# Patient Record
Sex: Male | Born: 1995 | Race: White | Hispanic: No | Marital: Single | Smoking: Former smoker
Health system: Southern US, Community
[De-identification: ages and names within clinical notes are randomized; demographics above are authoritative.]

## PROBLEM LIST (undated history)

## (undated) DIAGNOSIS — R42 Dizziness and giddiness: Secondary | ICD-10-CM

## (undated) DIAGNOSIS — F419 Anxiety disorder, unspecified: Secondary | ICD-10-CM

## (undated) DIAGNOSIS — F32A Depression, unspecified: Secondary | ICD-10-CM

## (undated) DIAGNOSIS — F909 Attention-deficit hyperactivity disorder, unspecified type: Secondary | ICD-10-CM

## (undated) DIAGNOSIS — K219 Gastro-esophageal reflux disease without esophagitis: Secondary | ICD-10-CM

## (undated) DIAGNOSIS — R55 Syncope and collapse: Secondary | ICD-10-CM

## (undated) DIAGNOSIS — E669 Obesity, unspecified: Secondary | ICD-10-CM

## (undated) DIAGNOSIS — R110 Nausea: Secondary | ICD-10-CM

## (undated) HISTORY — DX: Attention-deficit hyperactivity disorder, unspecified type: F90.9

## (undated) HISTORY — PX: OTHER SURGICAL HISTORY: SHX169

## (undated) HISTORY — DX: Dizziness and giddiness: R42

## (undated) HISTORY — DX: Gastro-esophageal reflux disease without esophagitis: K21.9

## (undated) HISTORY — DX: Nausea: R11.0

## (undated) HISTORY — DX: Anxiety disorder, unspecified: F41.9

## (undated) HISTORY — DX: Obesity, unspecified: E66.9

## (undated) HISTORY — DX: Syncope and collapse: R55

## (undated) HISTORY — DX: Depression, unspecified: F32.A

---
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
## 2013-02-11 ENCOUNTER — Ambulatory Visit (HOSPITAL_COMMUNITY)
Admission: RE | Admit: 2013-02-11 | Discharge: 2013-02-11 | Disposition: A | Discharge status: Home / Self Care | Payer: 59 | Source: Ambulatory Visit | Attending: Cardiovascular Disease | Admitting: Cardiovascular Disease

## 2013-02-11 DIAGNOSIS — R55 Syncope and collapse: Secondary | ICD-10-CM | POA: Insufficient documentation

## 2013-02-11 NOTE — Progress Notes (Signed)
*  PRELIMINARY RESULTS* Echocardiogram 2D Echocardiogram has been performed.  Kevin Wyatt 02/11/2013, 11:11 AM

## 2013-02-24 ENCOUNTER — Emergency Department (HOSPITAL_COMMUNITY)
Admission: EM | Admit: 2013-02-24 | Discharge: 2013-02-24 | Disposition: A | Discharge status: Home / Self Care | Payer: 59 | Attending: Emergency Medicine | Admitting: Emergency Medicine

## 2013-02-24 ENCOUNTER — Encounter (HOSPITAL_COMMUNITY): Payer: Self-pay | Admitting: *Deleted

## 2013-02-24 DIAGNOSIS — F909 Attention-deficit hyperactivity disorder, unspecified type: Secondary | ICD-10-CM | POA: Insufficient documentation

## 2013-02-24 DIAGNOSIS — F10929 Alcohol use, unspecified with intoxication, unspecified: Secondary | ICD-10-CM

## 2013-02-24 DIAGNOSIS — F17200 Nicotine dependence, unspecified, uncomplicated: Secondary | ICD-10-CM | POA: Insufficient documentation

## 2013-02-24 DIAGNOSIS — F1210 Cannabis abuse, uncomplicated: Secondary | ICD-10-CM | POA: Insufficient documentation

## 2013-02-24 DIAGNOSIS — Z79899 Other long term (current) drug therapy: Secondary | ICD-10-CM | POA: Insufficient documentation

## 2013-02-24 DIAGNOSIS — F1010 Alcohol abuse, uncomplicated: Secondary | ICD-10-CM | POA: Insufficient documentation

## 2013-02-24 LAB — URINALYSIS, ROUTINE W REFLEX MICROSCOPIC
Bilirubin Urine: NEGATIVE
Leukocytes, UA: NEGATIVE
Nitrite: NEGATIVE
Specific Gravity, Urine: 1.01 (ref 1.005–1.030)
Urobilinogen, UA: 0.2 mg/dL (ref 0.0–1.0)
pH: 6 (ref 5.0–8.0)

## 2013-02-24 LAB — COMPREHENSIVE METABOLIC PANEL
Albumin: 4.5 g/dL (ref 3.5–5.2)
Alkaline Phosphatase: 89 U/L (ref 39–117)
BUN: 6 mg/dL (ref 6–23)
Calcium: 9.1 mg/dL (ref 8.4–10.5)
GFR calc Af Amer: >90 mL/min (ref >90)
Potassium: 4.1 mEq/L (ref 3.5–5.1)
Sodium: 143 mEq/L (ref 135–145)
Total Protein: 7.6 g/dL (ref 6.0–8.3)

## 2013-02-24 LAB — RAPID URINE DRUG SCREEN, HOSP PERFORMED
Barbiturates: NOT DETECTED
Benzodiazepines: NOT DETECTED
Cocaine: NOT DETECTED
Opiates: NOT DETECTED

## 2013-02-24 LAB — CBC WITH DIFFERENTIAL/PLATELET
Basophils Relative: 0 % (ref 0–1)
Eosinophils Absolute: 0 10*3/uL (ref 0.0–0.7)
Eosinophils Relative: 0 % (ref 0–5)
MCH: 30 pg (ref 26.0–34.0)
MCHC: 35.4 g/dL (ref 30.0–36.0)
MCV: 84.9 fL (ref 78.0–100.0)
Monocytes Relative: 11 % (ref 3–12)
Neutrophils Relative %: 64 % (ref 43–77)
Platelets: 253 10*3/uL (ref 150–400)

## 2013-02-24 LAB — ETHANOL: Alcohol, Ethyl (B): 192 mg/dL — ABNORMAL HIGH (ref 0–11)

## 2013-02-24 NOTE — BHH Counselor (Signed)
Writer received call from [employer] requesting community resource information on etoh treatment. Writer printed and provided pt with the information. Denice Bors, AADC 02/24/2013 9:31 AM
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
## 2013-02-26 NOTE — ED Provider Notes (Signed)
Medical screening examination/treatment/procedure(s) were performed by non-physician practitioner and as supervising physician I was immediately available for consultation/collaboration.   Thalia Turkington E Erven Ramson, MD 02/26/13 1111 

## 2013-03-01 ENCOUNTER — Other Ambulatory Visit (HOSPITAL_COMMUNITY): Payer: Self-pay | Admitting: Internal Medicine

## 2013-03-01 DIAGNOSIS — R1032 Left lower quadrant pain: Secondary | ICD-10-CM

## 2013-03-06 ENCOUNTER — Ambulatory Visit (HOSPITAL_COMMUNITY): Payer: 59

## 2013-09-02 ENCOUNTER — Other Ambulatory Visit (HOSPITAL_COMMUNITY): Payer: Self-pay | Admitting: Family Medicine

## 2013-09-02 ENCOUNTER — Ambulatory Visit (HOSPITAL_COMMUNITY)
Admission: RE | Admit: 2013-09-02 | Discharge: 2013-09-02 | Disposition: A | Discharge status: Home / Self Care | Payer: 59 | Source: Ambulatory Visit | Attending: Family Medicine | Admitting: Family Medicine

## 2013-09-02 DIAGNOSIS — M546 Pain in thoracic spine: Secondary | ICD-10-CM

## 2013-09-02 DIAGNOSIS — M549 Dorsalgia, unspecified: Secondary | ICD-10-CM

## 2014-08-04 ENCOUNTER — Encounter (HOSPITAL_COMMUNITY): Payer: Self-pay | Admitting: Emergency Medicine

## 2014-08-04 ENCOUNTER — Emergency Department (HOSPITAL_COMMUNITY)
Admission: EM | Admit: 2014-08-04 | Discharge: 2014-08-04 | Disposition: A | Discharge status: Home / Self Care | Payer: 59 | Attending: Emergency Medicine | Admitting: Emergency Medicine

## 2014-08-04 DIAGNOSIS — F332 Major depressive disorder, recurrent severe without psychotic features: Secondary | ICD-10-CM

## 2014-08-04 DIAGNOSIS — F1994 Other psychoactive substance use, unspecified with psychoactive substance-induced mood disorder: Secondary | ICD-10-CM | POA: Diagnosis not present

## 2014-08-04 DIAGNOSIS — Z720 Tobacco use: Secondary | ICD-10-CM | POA: Insufficient documentation

## 2014-08-04 DIAGNOSIS — F1010 Alcohol abuse, uncomplicated: Secondary | ICD-10-CM

## 2014-08-04 DIAGNOSIS — F1019 Alcohol abuse with unspecified alcohol-induced disorder: Secondary | ICD-10-CM | POA: Diagnosis not present

## 2014-08-04 DIAGNOSIS — R45851 Suicidal ideations: Secondary | ICD-10-CM | POA: Diagnosis not present

## 2014-08-04 DIAGNOSIS — R45850 Homicidal ideations: Secondary | ICD-10-CM | POA: Insufficient documentation

## 2014-08-04 DIAGNOSIS — Z79899 Other long term (current) drug therapy: Secondary | ICD-10-CM | POA: Insufficient documentation

## 2014-08-04 LAB — COMPREHENSIVE METABOLIC PANEL
ALBUMIN: 4.6 g/dL (ref 3.5–5.2)
ALK PHOS: 77 U/L (ref 39–117)
ALT: 27 U/L (ref 0–53)
AST: 20 U/L (ref 0–37)
Anion gap: 16 — ABNORMAL HIGH (ref 5–15)
BILIRUBIN TOTAL: 0.2 mg/dL — AB (ref 0.3–1.2)
BUN: 8 mg/dL (ref 6–23)
CHLORIDE: 104 meq/L (ref 96–112)
CO2: 22 mEq/L (ref 19–32)
CREATININE: 0.89 mg/dL (ref 0.50–1.35)
Calcium: 9.6 mg/dL (ref 8.4–10.5)
GFR calc Af Amer: >90 mL/min (ref >90)
GFR calc non Af Amer: >90 mL/min (ref >90)
Glucose, Bld: 84 mg/dL (ref 70–99)
POTASSIUM: 3.8 meq/L (ref 3.7–5.3)
Sodium: 142 mEq/L (ref 137–147)
Total Protein: 8.2 g/dL (ref 6.0–8.3)

## 2014-08-04 LAB — CBC
HCT: 49.8 % (ref 39.0–52.0)
Hemoglobin: 17.6 g/dL — ABNORMAL HIGH (ref 13.0–17.0)
MCH: 30.7 pg (ref 26.0–34.0)
MCHC: 35.3 g/dL (ref 30.0–36.0)
MCV: 86.8 fL (ref 78.0–100.0)
PLATELETS: 263 10*3/uL (ref 150–400)
RBC: 5.74 MIL/uL (ref 4.22–5.81)
RDW: 12.8 % (ref 11.5–15.5)
WBC: 8.4 10*3/uL (ref 4.0–10.5)

## 2014-08-04 LAB — RAPID URINE DRUG SCREEN, HOSP PERFORMED
Amphetamines: NOT DETECTED
BARBITURATES: NOT DETECTED
BENZODIAZEPINES: NOT DETECTED
COCAINE: NOT DETECTED
Opiates: NOT DETECTED
Tetrahydrocannabinol: NOT DETECTED

## 2014-08-04 LAB — ETHANOL: ALCOHOL ETHYL (B): 222 mg/dL — AB (ref 0–11)

## 2014-08-04 LAB — SALICYLATE LEVEL: Salicylate Lvl: <2 mg/dL — ABNORMAL LOW (ref 2.8–20.0)

## 2014-08-04 LAB — ACETAMINOPHEN LEVEL

## 2014-08-04 MED ORDER — ONDANSETRON HCL 4 MG PO TABS: 4.0000 mg | ORAL_TABLET | Freq: Three times a day (TID) | ORAL | Status: DC | PRN

## 2014-08-04 MED ORDER — IBUPROFEN 200 MG PO TABS: 600.0000 mg | ORAL_TABLET | Freq: Three times a day (TID) | ORAL | Status: DC | PRN

## 2014-08-04 MED ORDER — LORAZEPAM 1 MG PO TABS: 1.0000 mg | ORAL_TABLET | Freq: Three times a day (TID) | ORAL | Status: DC | PRN

## 2014-08-04 MED ORDER — ZOLPIDEM TARTRATE 5 MG PO TABS: 5.0000 mg | ORAL_TABLET | Freq: Every evening | ORAL | Status: DC | PRN

## 2014-08-04 MED ORDER — NICOTINE 21 MG/24HR TD PT24
21.0000 mg | MEDICATED_PATCH | Freq: Every day | TRANSDERMAL | Status: DC
Start: 2014-08-04 — End: 2014-08-04

## 2014-08-04 MED ORDER — ALUM & MAG HYDROXIDE-SIMETH 200-200-20 MG/5ML PO SUSP: 30.0000 mL | ORAL | Status: DC | PRN

## 2014-08-04 MED ORDER — ACETAMINOPHEN 325 MG PO TABS: 650.0000 mg | ORAL_TABLET | ORAL | Status: DC | PRN

## 2014-08-04 NOTE — ED Notes (Signed)
Pt belongings consisting of pants, shirt, socks, shoes, 2 packs of cigarettes, Black Iphone, Wallet containing 2 global cash cards, [employer] gift card, Identification card, Silver necklace, and 2 earrings.
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
## 2014-08-04 NOTE — BH Assessment (Signed)
Writer informed that patient's DOB on IVC was incorrect. Writer contacted day time [employer] [place]. He sts that it will be ok to draw a line and initial DOB. Writer wrote in DOB and magistrate stated that this would be ok.
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
## 2014-08-04 NOTE — ED Provider Notes (Signed)
CSN: [id number]     Arrival date & time 08/04/14  0055 History   First MD Initiated Contact with Patient 08/04/14 0204     Chief Complaint  Patient presents with  . Suicidal  . Homicidal      HPI Patient presents with both suicidal and homicidal ideation tonight.  He does admit to drinking alcohol.  He states he has a history of depression but is not on any medications.  He states he was going to kill himself by cutting his wrists and then slitting his throat.  Does not have a psychiatrist.  No other significant complaints.  No prior suicide attempts.  No prior mental health hospitalizations per the patient.   History reviewed. No pertinent past medical history. History reviewed. No pertinent past surgical history. No family history on file. History  Substance Use Topics  . Smoking status: Current Every Day Smoker  . Smokeless tobacco: Not on file  . Alcohol Use: Yes     Comment: occasionally     Review of Systems  All other systems reviewed and are negative.     Allergies  Review of patient's allergies indicates no known allergies.  Home Medications   Prior to Admission medications   Medication Sig Start Date End Date Taking? Authorizing Provider  methylphenidate (CONCERTA) 36 MG CR tablet Take 36 mg by mouth daily.    Yes Historical Provider, MD   BP 159/90  Pulse 108  Temp(Src) 98.4 F (36.9 C) (Oral)  Resp 18  SpO2 98% Physical Exam  Nursing note and vitals reviewed. Constitutional: He is oriented to person, place, and time. He appears well-developed and well-nourished.  HENT:  Head: Normocephalic and atraumatic.  Eyes: EOM are normal.  Neck: Normal range of motion.  Cardiovascular: Normal rate, regular rhythm, normal heart sounds and intact distal pulses.   Pulmonary/Chest: Effort normal and breath sounds normal. No respiratory distress.  Abdominal: Soft. He exhibits no distension. There is no tenderness.  Musculoskeletal: Normal range of motion.   Neurological: He is alert and oriented to person, place, and time.  Skin: Skin is warm and dry.  Psychiatric:  Flat affect.  Suicidal ideation with plan    ED Course  Procedures (including critical care time) Labs Review Labs Reviewed  CBC - Abnormal; Notable for the following:    Hemoglobin 17.6 (*)    All other components within normal limits  COMPREHENSIVE METABOLIC PANEL - Abnormal; Notable for the following:    Total Bilirubin 0.2 (*)    Anion gap 16 (*)    All other components within normal limits  ETHANOL - Abnormal; Notable for the following:    Alcohol, Ethyl (B) 222 (*)    All other components within normal limits  SALICYLATE LEVEL - Abnormal; Notable for the following:    Salicylate Lvl <2.0 (*)    All other components within normal limits  ACETAMINOPHEN LEVEL  URINE RAPID DRUG SCREEN (HOSP PERFORMED)    Imaging Review No results found.   EKG Interpretation None      MDM   Final diagnoses:  None    Patient is under IVC by police department.  Agree with IVC.  Suicidal and homicidal.  CT is to evaluate.  Medically clear    Lyanne CoKevin M Susann [place], MD 08/04/14 [phone]

## 2014-08-04 NOTE — BH Assessment (Signed)
[place] from [place] [facility]. St[facility] by Dr. Wendall StadeKohl. Nursing report # is [phone].
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
## 2014-08-04 NOTE — ED Notes (Signed)
Bed: WBH34 Expected date:  Expected time:  Means of arrival:  Comments: tr4 

## 2015-03-16 ENCOUNTER — Encounter (HOSPITAL_COMMUNITY): Payer: Self-pay | Admitting: Emergency Medicine

## 2015-03-16 ENCOUNTER — Emergency Department (HOSPITAL_COMMUNITY)
Admission: EM | Admit: 2015-03-16 | Discharge: 2015-03-16 | Disposition: A | Discharge status: Home / Self Care | Payer: 59 | Attending: Emergency Medicine | Admitting: Emergency Medicine

## 2015-03-16 DIAGNOSIS — F10129 Alcohol abuse with intoxication, unspecified: Secondary | ICD-10-CM | POA: Insufficient documentation

## 2015-03-16 DIAGNOSIS — F10929 Alcohol use, unspecified with intoxication, unspecified: Secondary | ICD-10-CM

## 2015-03-16 DIAGNOSIS — Z720 Tobacco use: Secondary | ICD-10-CM | POA: Diagnosis not present

## 2015-03-16 DIAGNOSIS — Y9289 Other specified places as the place of occurrence of the external cause: Secondary | ICD-10-CM | POA: Diagnosis not present

## 2015-03-16 DIAGNOSIS — Y998 Other external cause status: Secondary | ICD-10-CM | POA: Insufficient documentation

## 2015-03-16 DIAGNOSIS — S60512A Abrasion of left hand, initial encounter: Secondary | ICD-10-CM | POA: Diagnosis not present

## 2015-03-16 DIAGNOSIS — Z79899 Other long term (current) drug therapy: Secondary | ICD-10-CM | POA: Diagnosis not present

## 2015-03-16 DIAGNOSIS — Y9389 Activity, other specified: Secondary | ICD-10-CM | POA: Insufficient documentation

## 2015-03-16 DIAGNOSIS — X58XXXA Exposure to other specified factors, initial encounter: Secondary | ICD-10-CM | POA: Insufficient documentation

## 2015-03-16 DIAGNOSIS — R451 Restlessness and agitation: Secondary | ICD-10-CM

## 2015-03-16 LAB — CBC WITH DIFFERENTIAL/PLATELET
Basophils Absolute: 0 10*3/uL (ref 0.0–0.1)
Basophils Relative: 0 % (ref 0–1)
EOS ABS: 0 10*3/uL (ref 0.0–0.7)
Eosinophils Relative: 0 % (ref 0–5)
HEMATOCRIT: 45.9 % (ref 39.0–52.0)
HEMOGLOBIN: 16.3 g/dL (ref 13.0–17.0)
LYMPHS PCT: 13 % (ref 12–46)
Lymphs Abs: 1.5 10*3/uL (ref 0.7–4.0)
MCH: 30.4 pg (ref 26.0–34.0)
MCHC: 35.5 g/dL (ref 30.0–36.0)
MCV: 85.6 fL (ref 78.0–100.0)
MONOS PCT: 9 % (ref 3–12)
Monocytes Absolute: 1.1 10*3/uL — ABNORMAL HIGH (ref 0.1–1.0)
NEUTROS PCT: 78 % — AB (ref 43–77)
Neutro Abs: 8.9 10*3/uL — ABNORMAL HIGH (ref 1.7–7.7)
PLATELETS: 267 10*3/uL (ref 150–400)
RBC: 5.36 MIL/uL (ref 4.22–5.81)
RDW: 12.7 % (ref 11.5–15.5)
WBC: 11.5 10*3/uL — AB (ref 4.0–10.5)

## 2015-03-16 LAB — URINALYSIS, ROUTINE W REFLEX MICROSCOPIC
BILIRUBIN URINE: NEGATIVE
GLUCOSE, UA: NEGATIVE mg/dL
Hgb urine dipstick: NEGATIVE
KETONES UR: NEGATIVE mg/dL
Leukocytes, UA: NEGATIVE
Nitrite: NEGATIVE
Protein, ur: NEGATIVE mg/dL
SPECIFIC GRAVITY, URINE: 1.003 — AB (ref 1.005–1.030)
Urobilinogen, UA: 0.2 mg/dL (ref 0.0–1.0)
pH: 6 (ref 5.0–8.0)

## 2015-03-16 LAB — COMPREHENSIVE METABOLIC PANEL
ALK PHOS: 66 U/L (ref 38–126)
ALT: 25 U/L (ref 17–63)
AST: 24 U/L (ref 15–41)
Albumin: 4.2 g/dL (ref 3.5–5.0)
Anion gap: 12 (ref 5–15)
BUN: 9 mg/dL (ref 6–20)
CO2: 21 mmol/L — AB (ref 22–32)
Calcium: 8.8 mg/dL — ABNORMAL LOW (ref 8.9–10.3)
Chloride: 110 mmol/L (ref 101–111)
Creatinine, Ser: 1.28 mg/dL — ABNORMAL HIGH (ref 0.61–1.24)
GFR calc non Af Amer: >60 mL/min (ref >60)
Glucose, Bld: 105 mg/dL — ABNORMAL HIGH (ref 65–99)
Potassium: 3.8 mmol/L (ref 3.5–5.1)
Sodium: 143 mmol/L (ref 135–145)
TOTAL PROTEIN: 7.1 g/dL (ref 6.5–8.1)
Total Bilirubin: 0.4 mg/dL (ref 0.3–1.2)

## 2015-03-16 LAB — RAPID URINE DRUG SCREEN, HOSP PERFORMED
AMPHETAMINES: NOT DETECTED
Barbiturates: NOT DETECTED
Benzodiazepines: NOT DETECTED
COCAINE: NOT DETECTED
Opiates: NOT DETECTED
Tetrahydrocannabinol: NOT DETECTED

## 2015-03-16 LAB — ETHANOL: Alcohol, Ethyl (B): 292 mg/dL — ABNORMAL HIGH (ref <5)

## 2015-03-16 LAB — ACETAMINOPHEN LEVEL: Acetaminophen (Tylenol), Serum: <10 ug/mL — ABNORMAL LOW (ref 10–30)

## 2015-03-16 LAB — SALICYLATE LEVEL: Salicylate Lvl: <4 mg/dL (ref 2.8–30.0)

## 2015-03-16 MED ORDER — LORAZEPAM 2 MG/ML IJ SOLN
1.0000 mg | Freq: Once | INTRAMUSCULAR | Status: AC
  Administered 2015-03-16: 1 mg via INTRAMUSCULAR

## 2015-03-16 MED ORDER — SODIUM CHLORIDE 0.9 % IV SOLN
INTRAVENOUS | Status: AC
  Administered 2015-03-16 (×2): via INTRAVENOUS

## 2015-03-16 MED ORDER — ZIPRASIDONE MESYLATE 20 MG IM SOLR
20.0000 mg | Freq: Once | INTRAMUSCULAR | Status: AC
  Administered 2015-03-16: 20 mg via INTRAMUSCULAR

## 2015-03-16 MED ORDER — LORAZEPAM 2 MG/ML IJ SOLN
INTRAMUSCULAR | Status: AC
  Filled 2015-03-16: qty 1

## 2015-03-16 NOTE — ED Notes (Addendum)
Upon entering room to check on patient, patient noted to be increasingly emotional. [employer] at bedside advise that they are currently pursuing warrants for patient. Patient states "please just let me leave of my own free will". Attempts to de-escalate patient unsuccessful. Patient restrained by [employer] to stretcher using handcuffs at this time. [employer] officer states that patient is currently in their custody.

## 2015-03-16 NOTE — ED Notes (Signed)
Pt resting, [employer] at bedside.  

## 2015-03-16 NOTE — Discharge Instructions (Signed)
Alcohol Intoxication  Alcohol intoxication occurs when the amount of alcohol that a person has consumed impairs his or her ability to mentally and physically function. Alcohol directly impairs the normal chemical activity of the brain. Drinking large amounts of alcohol can lead to changes in mental function and behavior, and it can cause many physical effects that can be harmful.   Alcohol intoxication can range in severity from mild to very severe. Various factors can affect the level of intoxication that occurs, such as the person's age, gender, weight, frequency of alcohol consumption, and the presence of other medical conditions (such as diabetes, seizures, or heart conditions). Dangerous levels of alcohol intoxication may occur when people drink large amounts of alcohol in a short period (binge drinking). Alcohol can also be especially dangerous when combined with certain prescription medicines or "recreational" drugs.  SIGNS AND SYMPTOMS  Some common signs and symptoms of mild alcohol intoxication include:  · Loss of coordination.  · Changes in mood and behavior.  · Impaired judgment.  · Slurred speech.  As alcohol intoxication progresses to more severe levels, other signs and symptoms will appear. These may include:  · Vomiting.  · Confusion and impaired memory.  · Slowed breathing.  · Seizures.  · Loss of consciousness.  DIAGNOSIS   Your health care provider will take a medical history and perform a physical exam. You will be asked about the amount and type of alcohol you have consumed. Blood tests will be done to measure the concentration of alcohol in your blood. In many places, your blood alcohol level must be lower than 80 mg/dL (0.08%) to legally drive. However, many dangerous effects of alcohol can occur at much lower levels.   TREATMENT   People with alcohol intoxication often do not require treatment. Most of the effects of alcohol intoxication are temporary, and they go away as the alcohol naturally  leaves the body. Your health care provider will monitor your condition until you are stable enough to go home. Fluids are sometimes given through an IV access tube to help prevent dehydration.   HOME CARE INSTRUCTIONS  · Do not drive after drinking alcohol.  · Stay hydrated. Drink enough water and fluids to keep your urine clear or pale yellow. Avoid caffeine.    · Only take over-the-counter or prescription medicines as directed by your health care provider.    SEEK MEDICAL CARE IF:   · You have persistent vomiting.    · You do not feel better after a few days.  · You have frequent alcohol intoxication. Your health care provider can help determine if you should see a substance use treatment counselor.  SEEK IMMEDIATE MEDICAL CARE IF:   · You become shaky or tremble when you try to stop drinking.    · You shake uncontrollably (seizure).    · You throw up (vomit) blood. This may be bright red or may look like black coffee grounds.    · You have blood in your stool. This may be bright red or may appear as a black, tarry, bad smelling stool.    · You become lightheaded or faint.    MAKE SURE YOU:   · Understand these instructions.  · Will watch your condition.  · Will get help right away if you are not doing well or get worse.  Document Released: 07/13/2005 Document Revised: 06/05/2013 Document Reviewed: 03/08/2013  ExitCare® Patient Information ©2015 [employer]. This information is not intended to replace advice given to you by your health care provider. Make sure   you discuss any questions you have with your health care provider.
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
## 2015-03-16 NOTE — ED Notes (Signed)
Patient remains in police custody. [employer] officer reapplied handcuffs to bilateral wrists at this time.
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
## 2015-05-07 ENCOUNTER — Encounter: Payer: Self-pay | Admitting: *Deleted

## 2015-06-10 ENCOUNTER — Encounter: Payer: Self-pay | Admitting: Cardiovascular Disease

## 2015-08-04 ENCOUNTER — Encounter (HOSPITAL_COMMUNITY): Payer: Self-pay | Admitting: *Deleted

## 2015-08-04 ENCOUNTER — Emergency Department (HOSPITAL_COMMUNITY)
Admission: EM | Admit: 2015-08-04 | Discharge: 2015-08-04 | Disposition: A | Discharge status: Home / Self Care | Payer: Commercial Managed Care - HMO | Attending: Emergency Medicine | Admitting: Emergency Medicine

## 2015-08-04 ENCOUNTER — Emergency Department (HOSPITAL_COMMUNITY): Payer: Commercial Managed Care - HMO

## 2015-08-04 DIAGNOSIS — K219 Gastro-esophageal reflux disease without esophagitis: Secondary | ICD-10-CM | POA: Diagnosis not present

## 2015-08-04 DIAGNOSIS — Z8659 Personal history of other mental and behavioral disorders: Secondary | ICD-10-CM | POA: Insufficient documentation

## 2015-08-04 DIAGNOSIS — E669 Obesity, unspecified: Secondary | ICD-10-CM | POA: Insufficient documentation

## 2015-08-04 DIAGNOSIS — Z720 Tobacco use: Secondary | ICD-10-CM | POA: Insufficient documentation

## 2015-08-04 DIAGNOSIS — R079 Chest pain, unspecified: Secondary | ICD-10-CM | POA: Diagnosis present

## 2015-08-04 LAB — CBC WITH DIFFERENTIAL/PLATELET
BASOS PCT: 0 %
Basophils Absolute: 0 10*3/uL (ref 0.0–0.1)
Eosinophils Absolute: 0 10*3/uL (ref 0.0–0.7)
Eosinophils Relative: 1 %
HEMATOCRIT: 44.1 % (ref 39.0–52.0)
HEMOGLOBIN: 15.3 g/dL (ref 13.0–17.0)
LYMPHS ABS: 1.5 10*3/uL (ref 0.7–4.0)
Lymphocytes Relative: 27 %
MCH: 29.7 pg (ref 26.0–34.0)
MCHC: 34.7 g/dL (ref 30.0–36.0)
MCV: 85.6 fL (ref 78.0–100.0)
MONO ABS: 0.7 10*3/uL (ref 0.1–1.0)
MONOS PCT: 13 %
NEUTROS ABS: 3.4 10*3/uL (ref 1.7–7.7)
NEUTROS PCT: 60 %
Platelets: 218 10*3/uL (ref 150–400)
RBC: 5.15 MIL/uL (ref 4.22–5.81)
RDW: 12.5 % (ref 11.5–15.5)
WBC: 5.7 10*3/uL (ref 4.0–10.5)

## 2015-08-04 LAB — COMPREHENSIVE METABOLIC PANEL
ALBUMIN: 4.2 g/dL (ref 3.5–5.0)
ALK PHOS: 54 U/L (ref 38–126)
ALT: 26 U/L (ref 17–63)
ANION GAP: 6 (ref 5–15)
AST: 18 U/L (ref 15–41)
BILIRUBIN TOTAL: 0.7 mg/dL (ref 0.3–1.2)
BUN: 13 mg/dL (ref 6–20)
CALCIUM: 9.4 mg/dL (ref 8.9–10.3)
CO2: 28 mmol/L (ref 22–32)
Chloride: 106 mmol/L (ref 101–111)
Creatinine, Ser: 1.12 mg/dL (ref 0.61–1.24)
GLUCOSE: 78 mg/dL (ref 65–99)
POTASSIUM: 3.9 mmol/L (ref 3.5–5.1)
Sodium: 140 mmol/L (ref 135–145)
TOTAL PROTEIN: 7.1 g/dL (ref 6.5–8.1)

## 2015-08-04 LAB — I-STAT TROPONIN, ED: TROPONIN I, POC: 0 ng/mL (ref 0.00–0.08)

## 2015-08-04 MED ORDER — RANITIDINE HCL 150 MG PO CAPS: 300.0000 mg | ORAL_CAPSULE | Freq: Every evening | ORAL | Status: DC

## 2015-08-04 MED ORDER — TRAMADOL HCL 50 MG PO TABS: 50.0000 mg | ORAL_TABLET | Freq: Two times a day (BID) | ORAL | Status: DC | PRN

## 2015-08-04 NOTE — Discharge Instructions (Signed)
Follow up with your md next week for recheck °

## 2015-08-04 NOTE — ED Provider Notes (Signed)
CSN: [id number]     Arrival date & time 08/04/15  1631 History   First MD Initiated Contact with Patient 08/04/15 1646     Chief Complaint  Patient presents with  . Chest Pain     (Consider location/radiation/quality/duration/timing/severity/associated sxs/prior Treatment) Patient is a 19 y.o. male presenting with chest pain. The history is provided by the patient (The patient complains of chest pain over sternum consistent aching for a few days no pain with inspiration the pain is nonexertional).  Chest Pain Pain location:  Substernal area Pain quality: aching   Pain radiates to:  Does not radiate Pain radiates to the back: no   Pain severity:  Mild Onset quality:  Sudden Timing:  Intermittent Associated symptoms: no abdominal pain, no back pain, no cough, no fatigue and no headache     Past Medical History  Diagnosis Date  . ADHD (attention deficit hyperactivity disorder)   . Anxiety   . Dizziness   . GERD (gastroesophageal reflux disease)     w/ prrosis & erucatation  . Nausea   . Obesity   . Vasovagal syncope    History reviewed. No pertinent past surgical history. Family History  Problem Relation Age of Onset  . Hypertension Mother   . Hypertension Father   . Alzheimer's disease Maternal Grandmother   . Stroke Maternal Grandmother   . Heart attack Maternal Grandmother   . Hypertension Maternal Grandfather   . Hypertension Paternal Grandmother   . Heart attack Paternal Grandmother   . Lung cancer Paternal Grandmother   . Cancer Paternal Grandfather    Social History  Substance Use Topics  . Smoking status: Current Every Day Smoker  . Smokeless tobacco: None     Comment: uses vape   . Alcohol Use: Yes     Comment: excessive tonight    Review of Systems  Constitutional: Negative for appetite change and fatigue.  HENT: Negative for congestion, ear discharge and sinus pressure.   Eyes: Negative for discharge.  Respiratory: Negative for cough.    Cardiovascular: Positive for chest pain.  Gastrointestinal: Negative for abdominal pain and diarrhea.  Genitourinary: Negative for frequency and hematuria.  Musculoskeletal: Negative for back pain.  Skin: Negative for rash.  Neurological: Negative for seizures and headaches.  Psychiatric/Behavioral: Negative for hallucinations.      Allergies  Review of patient's allergies indicates no known allergies.  Home Medications   Prior to Admission medications   Medication Sig Start Date End Date Taking? Authorizing Provider  guaifenesin (WAL-TUSSIN) 100 MG/5ML syrup Take 200 mg by mouth 3 (three) times daily as needed for cough.   Yes Historical Provider, MD  ranitidine (ZANTAC) 150 MG capsule Take 2 capsules (300 mg total) by mouth every evening. 08/04/15   Bethann BerkshireJoseph Taitum Menton, MD  traMADol (ULTRAM) 50 MG tablet Take 1 tablet (50 mg total) by mouth every 12 (twelve) hours as needed for moderate pain. 08/04/15   Bethann BerkshireJoseph Ashauna Bertholf, MD   BP 119/76 mmHg  Pulse 70  Temp(Src) 97.7 F (36.5 C) (Oral)  Resp 13  Ht 5\' 10"  (1.778 m)  Wt 220 lb (99.791 kg)  BMI 31.57 kg/m2  SpO2 97% Physical Exam  Constitutional: He is oriented to person, place, and time. He appears well-developed.  HENT:  Head: Normocephalic.  Eyes: Conjunctivae and EOM are normal. No scleral icterus.  Neck: Neck supple. No thyromegaly present.  Cardiovascular: Normal rate and regular rhythm.  Exam reveals no gallop and no friction rub.   No murmur heard. Pulmonary/Chest:  No stridor. He has no wheezes. He has no rales. He exhibits no tenderness.  Abdominal: He exhibits no distension. There is no tenderness. There is no rebound.  Musculoskeletal: Normal range of motion. He exhibits no edema.  Lymphadenopathy:    He has no cervical adenopathy.  Neurological: He is oriented to person, place, and time. He exhibits normal muscle tone. Coordination normal.  Skin: No rash noted. No erythema.  Psychiatric: He has a normal mood and  affect. His behavior is normal.    ED Course  Procedures (including critical care time) Labs Review Labs Reviewed  CBC WITH DIFFERENTIAL/PLATELET  COMPREHENSIVE METABOLIC PANEL  I-STAT TROPOININ, ED    Imaging Review Dg Chest 2 View  08/04/2015  CLINICAL DATA:  Mid chest pressure radiating slightly at the LEFT, shortness of breath, mild dizziness, history smoking EXAM: CHEST  2 VIEW COMPARISON:  11/02/2004 FINDINGS: Normal heart size, mediastinal contours, and pulmonary vascularity. Lungs clear. No pleural effusion or pneumothorax. Bones unremarkable. IMPRESSION: Normal exam. Electronically Signed   By: Ulyses Southward M.D.   On: 08/04/2015 17:09   I have personally reviewed and evaluated these images and lab results as part of my medical decision-making.   EKG Interpretation   Date/Time:  Tuesday August 04 2015 16:38:50 EDT Ventricular Rate:  76 PR Interval:  164 QRS Duration: 90 QT Interval:  338 QTC Calculation: 380 R Axis:   59 Text Interpretation:  Normal sinus rhythm Normal ECG Since last tracing  rate slower Confirmed by KNAPP  MD-J, JON ([id number]) on 08/04/2015 4:54:18 PM      MDM   Final diagnoses:  Chest pain at rest    Chest pain with normal labs EKG and chest x-ray and no risk factors. Doubt coronary artery disease. Will put patient on Zantac haven't take Ultram for pain not helped by Tylenol and follow-up with PCP    Bethann Berkshire, MD 08/04/15 1910

## 2015-08-04 NOTE — ED Notes (Signed)
Pt with pressure to mid chest that radiates slightly to left with SOB and mild dizziness, denies N/V or diaphoresis

## 2015-10-17 ENCOUNTER — Emergency Department (HOSPITAL_COMMUNITY)
Admission: EM | Admit: 2015-10-17 | Discharge: 2015-10-17 | Disposition: A | Discharge status: Home / Self Care | Payer: Commercial Managed Care - HMO | Attending: Emergency Medicine | Admitting: Emergency Medicine

## 2015-10-17 ENCOUNTER — Encounter (HOSPITAL_COMMUNITY): Payer: Self-pay

## 2015-10-17 DIAGNOSIS — K219 Gastro-esophageal reflux disease without esophagitis: Secondary | ICD-10-CM | POA: Insufficient documentation

## 2015-10-17 DIAGNOSIS — E669 Obesity, unspecified: Secondary | ICD-10-CM | POA: Diagnosis not present

## 2015-10-17 DIAGNOSIS — F17200 Nicotine dependence, unspecified, uncomplicated: Secondary | ICD-10-CM | POA: Insufficient documentation

## 2015-10-17 DIAGNOSIS — Z043 Encounter for examination and observation following other accident: Secondary | ICD-10-CM | POA: Diagnosis present

## 2015-10-17 DIAGNOSIS — F10129 Alcohol abuse with intoxication, unspecified: Secondary | ICD-10-CM | POA: Insufficient documentation

## 2015-10-17 DIAGNOSIS — F1010 Alcohol abuse, uncomplicated: Secondary | ICD-10-CM

## 2015-10-17 LAB — URINALYSIS, ROUTINE W REFLEX MICROSCOPIC
BILIRUBIN URINE: NEGATIVE
Glucose, UA: NEGATIVE mg/dL
KETONES UR: NEGATIVE mg/dL
Leukocytes, UA: NEGATIVE
NITRITE: NEGATIVE
PH: 6 (ref 5.0–8.0)
Protein, ur: NEGATIVE mg/dL
Specific Gravity, Urine: <1.005 — ABNORMAL LOW (ref 1.005–1.030)

## 2015-10-17 LAB — URINE MICROSCOPIC-ADD ON

## 2015-10-17 LAB — ETHANOL: ALCOHOL ETHYL (B): 287 mg/dL — AB (ref <5)
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
## 2015-10-17 NOTE — ED Notes (Signed)
Pt acting combative when mother come to get pt. Pt will not allow nurse to assist with dressing.  Assistance called to room. Pt ambulatory to the bathroom with male tech.

## 2015-10-17 NOTE — ED Notes (Signed)
pt lives at home, officers called mother, which states he has a problem with alcohol, pt is calm, cooperative, ETOH, slowed speech

## 2015-10-17 NOTE — Discharge Instructions (Signed)

## 2015-10-17 NOTE — ED Provider Notes (Signed)
  Physical Exam  BP 112/58 mmHg  Pulse 93  Temp(Src) 98.2 F (36.8 C) (Oral)  Resp 14  Ht 5\' 9"  (1.753 m)  Wt 220 lb (99.791 kg)  BMI 32.47 kg/m2  SpO2 100%  Physical Exam  ED Course  Procedures  MDM Patient is now awake and more appropriate. Will discharge home.      Benjiman CoreNathan Marchia Diguglielmo, MD 10/17/15 [phone]

## 2015-10-17 NOTE — ED Notes (Signed)
Pt only c/o right shoulder pain.  Pt has strong smell of etoh
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
## 2015-10-17 NOTE — ED Notes (Signed)
Isabel CapriceCynthia Jump - Mother [phone]

## 2016-05-15 ENCOUNTER — Emergency Department (HOSPITAL_COMMUNITY)
Admission: EM | Admit: 2016-05-15 | Discharge: 2016-05-15 | Discharge status: Absent without leave | Payer: Commercial Managed Care - HMO

## 2016-05-30 ENCOUNTER — Encounter (HOSPITAL_COMMUNITY): Payer: Self-pay

## 2016-05-30 ENCOUNTER — Emergency Department (HOSPITAL_COMMUNITY)
Admission: EM | Admit: 2016-05-30 | Discharge: 2016-05-30 | Disposition: A | Discharge status: Other Acute Inpatient Hospital | Payer: Commercial Managed Care - HMO | Attending: Emergency Medicine | Admitting: Emergency Medicine

## 2016-05-30 ENCOUNTER — Inpatient Hospital Stay (HOSPITAL_COMMUNITY)
Admission: AD | Admit: 2016-05-30 | Discharge: 2016-06-02 | DRG: 897 | Disposition: A | Discharge status: Home / Self Care | Payer: 59 | Source: Intra-hospital | Attending: Psychiatry | Admitting: Psychiatry

## 2016-05-30 DIAGNOSIS — F1094 Alcohol use, unspecified with alcohol-induced mood disorder: Secondary | ICD-10-CM | POA: Diagnosis present

## 2016-05-30 DIAGNOSIS — F17210 Nicotine dependence, cigarettes, uncomplicated: Secondary | ICD-10-CM | POA: Diagnosis present

## 2016-05-30 DIAGNOSIS — R45851 Suicidal ideations: Secondary | ICD-10-CM | POA: Insufficient documentation

## 2016-05-30 DIAGNOSIS — F10920 Alcohol use, unspecified with intoxication, uncomplicated: Secondary | ICD-10-CM

## 2016-05-30 DIAGNOSIS — F10120 Alcohol abuse with intoxication, uncomplicated: Secondary | ICD-10-CM | POA: Insufficient documentation

## 2016-05-30 DIAGNOSIS — F1024 Alcohol dependence with alcohol-induced mood disorder: Secondary | ICD-10-CM | POA: Diagnosis present

## 2016-05-30 DIAGNOSIS — F909 Attention-deficit hyperactivity disorder, unspecified type: Secondary | ICD-10-CM | POA: Insufficient documentation

## 2016-05-30 DIAGNOSIS — F17200 Nicotine dependence, unspecified, uncomplicated: Secondary | ICD-10-CM | POA: Insufficient documentation

## 2016-05-30 DIAGNOSIS — Y907 Blood alcohol level of 200-239 mg/100 ml: Secondary | ICD-10-CM | POA: Diagnosis present

## 2016-05-30 DIAGNOSIS — Z79899 Other long term (current) drug therapy: Secondary | ICD-10-CM | POA: Diagnosis not present

## 2016-05-30 LAB — CBC WITH DIFFERENTIAL/PLATELET
BASOS ABS: 0 10*3/uL (ref 0.0–0.1)
Basophils Relative: 0 %
EOS ABS: 0 10*3/uL (ref 0.0–0.7)
Eosinophils Relative: 0 %
HCT: 47.8 % (ref 39.0–52.0)
HEMOGLOBIN: 17 g/dL (ref 13.0–17.0)
LYMPHS ABS: 2.6 10*3/uL (ref 0.7–4.0)
LYMPHS PCT: 24 %
MCH: 30.5 pg (ref 26.0–34.0)
MCHC: 35.6 g/dL (ref 30.0–36.0)
MCV: 85.7 fL (ref 78.0–100.0)
Monocytes Absolute: 0.9 10*3/uL (ref 0.1–1.0)
Monocytes Relative: 8 %
NEUTROS PCT: 68 %
Neutro Abs: 7.5 10*3/uL (ref 1.7–7.7)
PLATELETS: 299 10*3/uL (ref 150–400)
RBC: 5.58 MIL/uL (ref 4.22–5.81)
RDW: 12.7 % (ref 11.5–15.5)
WBC: 11.1 10*3/uL — AB (ref 4.0–10.5)

## 2016-05-30 LAB — COMPREHENSIVE METABOLIC PANEL
ALT: 55 U/L (ref 17–63)
ANION GAP: 10 (ref 5–15)
AST: 26 U/L (ref 15–41)
Albumin: 4.8 g/dL (ref 3.5–5.0)
Alkaline Phosphatase: 78 U/L (ref 38–126)
BUN: 13 mg/dL (ref 6–20)
CALCIUM: 8.7 mg/dL — AB (ref 8.9–10.3)
CHLORIDE: 110 mmol/L (ref 101–111)
CO2: 18 mmol/L — AB (ref 22–32)
CREATININE: 1.06 mg/dL (ref 0.61–1.24)
Glucose, Bld: 92 mg/dL (ref 65–99)
Potassium: 3.4 mmol/L — ABNORMAL LOW (ref 3.5–5.1)
SODIUM: 138 mmol/L (ref 135–145)
Total Bilirubin: 0.4 mg/dL (ref 0.3–1.2)
Total Protein: 8 g/dL (ref 6.5–8.1)

## 2016-05-30 LAB — SALICYLATE LEVEL: Salicylate Lvl: <4 mg/dL (ref 2.8–30.0)

## 2016-05-30 LAB — RAPID URINE DRUG SCREEN, HOSP PERFORMED
AMPHETAMINES: NOT DETECTED
BENZODIAZEPINES: NOT DETECTED
Barbiturates: NOT DETECTED
Cocaine: NOT DETECTED
OPIATES: NOT DETECTED
TETRAHYDROCANNABINOL: NOT DETECTED

## 2016-05-30 LAB — ACETAMINOPHEN LEVEL: Acetaminophen (Tylenol), Serum: <10 ug/mL — ABNORMAL LOW (ref 10–30)

## 2016-05-30 LAB — ETHANOL: ALCOHOL ETHYL (B): 201 mg/dL — AB (ref <5)

## 2016-05-30 MED ORDER — TRAZODONE HCL 50 MG PO TABS
50.0000 mg | ORAL_TABLET | Freq: Every evening | ORAL | Status: DC | PRN
  Administered 2016-06-01: 50 mg via ORAL
  Filled 2016-05-30: qty 1

## 2016-05-30 MED ORDER — ACETAMINOPHEN 325 MG PO TABS
650.0000 mg | ORAL_TABLET | Freq: Four times a day (QID) | ORAL | Status: DC | PRN
  Administered 2016-06-02: 650 mg via ORAL
  Filled 2016-05-30: qty 2

## 2016-05-30 MED ORDER — MAGNESIUM HYDROXIDE 400 MG/5ML PO SUSP: 30.0000 mL | Freq: Every day | ORAL | Status: DC | PRN

## 2016-05-30 MED ORDER — LORAZEPAM 1 MG PO TABS: 1.0000 mg | ORAL_TABLET | Freq: Four times a day (QID) | ORAL | Status: DC | PRN

## 2016-05-30 MED ORDER — ALUM & MAG HYDROXIDE-SIMETH 200-200-20 MG/5ML PO SUSP: 30.0000 mL | ORAL | Status: DC | PRN

## 2016-05-30 NOTE — Tx Team (Signed)
Initial Interdisciplinary Treatment Plan   PATIENT STRESSORS: Financial difficulties Marital or family conflict Substance abuse   PATIENT STRENGTHS: Ability for insight Wellsite geologistCommunication skills General fund of knowledge Motivation for treatment/growth Supportive family/friends   PROBLEM LIST: Problem List/Patient Goals Date to be addressed Date deferred Reason deferred Estimated date of resolution  Risk for suicide 05/30/16     ETOH abuse 05/30/16     "outpatient Treatment" 05/30/16                                          DISCHARGE CRITERIA:  Improved stabilization in mood, thinking, and/or behavior Verbal commitment to aftercare and medication compliance  PRELIMINARY DISCHARGE PLAN: Attend aftercare/continuing care group Attend 12-step recovery group Outpatient therapy  PATIENT/FAMIILY INVOLVEMENT: This treatment plan has been presented to and reviewed with the patient, Kevin Wyatt.  The patient and family have been given the opportunity to ask questions and make suggestions.  Jacques Navyhillips, Melicia Esqueda A 05/30/2016, 10:46 PM

## 2016-05-30 NOTE — ED Notes (Signed)
Copies made, report given to [place], Technical sales engineerfficer called for transport.

## 2016-05-30 NOTE — ED Notes (Signed)
Pt will nod head yes or no, pt did not eat breakfast or lunch.

## 2016-05-30 NOTE — ED Notes (Signed)
Patient ambulatory to restroom with steady gait, with tech

## 2016-05-30 NOTE — BH Assessment (Signed)
Patient accepted [facility] for Involuntary admission, MD [place]. Please call report to [phone].
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
## 2016-05-30 NOTE — ED Notes (Signed)
Pt drinking water, ginger al provided also
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
## 2016-05-30 NOTE — ED Notes (Signed)
[facility]

## 2016-05-30 NOTE — ED Notes (Signed)
EDP signed off on Officer, pt sleeping, IVC paper work being done.

## 2016-05-30 NOTE — Progress Notes (Signed)
Patient ID: Kasandra KnudsenKeith W XXXCarter, male   DOB: [birth year], 20 y.o.   MRN: [id number]  Admission Note:  D:20 yr male who presents IVC in no acute distress for the treatment of SI and ETOH abuse. Pt appears flat and depressed. Pt was calm and cooperative with admission process. . Pt denies SI/HI/ AVH . Pt minimizes the events leading up to him coming in, pt stated he had the box cutter but did not threaten to kill himself. Pt did admit to being drunk during incident, but refuses to admit to threatening to kill himself. Pt stated he would like out pt Tx and said he was ready to get some help.   A: Skin was assessed and found to be clear of any abnormal marks apart from a tattoo on L-wrist. PT searched and no contraband found, POC and unit policies explained and understanding verbalized. Consents obtained. Food and fluids offered, and fluids accepted.   R:Pt had no additional questions or concerns.

## 2016-05-30 NOTE — ED Notes (Signed)
[facility], pt has been accepted 340-[facility], EDP notified, pt calling mother. Paper work completed, will Hydrographic surveyorcall officer for transport

## 2016-05-30 NOTE — ED Notes (Signed)
[facility] to [facility] faxed, hoping to have bed this afternoon

## 2016-05-30 NOTE — ED Notes (Signed)
[facility] in [employer] custody

## 2016-05-30 NOTE — ED Notes (Signed)
Pt on the phone with Mother, calm, cooperative

## 2016-05-30 NOTE — ED Provider Notes (Addendum)
TIME SEEN: 5:00 AM  CHIEF COMPLAINT: Brought in by police for concerns for suicidal thoughts  HPI: Pt is a 20 y.o. male with history of ADHD, anxiety, alcohol abuse who presents to the emergency department [place] [facility] [place] [facility] [place] concerns for suicidal ideation. Patient reports he was drinking alcohol tonight and got into a fight with his brother. He states he took a box cutter and was holding it. [facility] he was threatening to cut himself, kill himself. Patient states it was "a cry for help" and he said it because he was angry. He states he does not want to kill himself or anyone else. Denies hallucinations. He denies to me that he has ever been seen before for suicidal ideation, suicide attempts but it appears he has been in the emergency department before for suicidal thoughts. Denies a history of a psychiatric admission. Is not on any psychiatric medications. Does report drinking alcohol heavily and did drink alcohol tonight. Denies any illicit drug use. No current medical complaints.  ROS: See HPI Constitutional: no fever  Eyes: no drainage  ENT: no runny nose   Cardiovascular:  no chest pain  Resp: no SOB  GI: no vomiting GU: no dysuria Integumentary: no rash  Allergy: no hives  Musculoskeletal: no leg swelling  Neurological: no slurred speech ROS otherwise negative  PAST MEDICAL HISTORY/PAST SURGICAL HISTORY:  Past Medical History:  Diagnosis Date  . ADHD (attention deficit hyperactivity disorder)   . Anxiety   . Dizziness   . GERD (gastroesophageal reflux disease)    w/ prrosis & erucatation  . Nausea   . Obesity   . Vasovagal syncope     MEDICATIONS:  Prior to Admission medications   Medication Sig Start Date End Date Taking? Authorizing Provider  guaifenesin (WAL-TUSSIN) 100 MG/5ML syrup Take 200 mg by mouth 3 (three) times daily as needed for cough.    Historical Provider, MD  ranitidine (ZANTAC) 150 MG  capsule Take 2 capsules (300 mg total) by mouth every evening. 08/04/15   Bethann BerkshireJoseph Zammit, MD  traMADol (ULTRAM) 50 MG tablet Take 1 tablet (50 mg total) by mouth every 12 (twelve) hours as needed for moderate pain. 08/04/15   Bethann BerkshireJoseph Zammit, MD    ALLERGIES:  No Known Allergies  SOCIAL HISTORY:  Social History  Substance Use Topics  . Smoking status: Current Every Day Smoker  . Smokeless tobacco: Not on file     Comment: uses vape   . Alcohol use Yes     Comment: excessive tonight    FAMILY HISTORY: Family History  Problem Relation Age of Onset  . Hypertension Mother   . Hypertension Father   . Alzheimer's disease Maternal Grandmother   . Stroke Maternal Grandmother   . Heart attack Maternal Grandmother   . Hypertension Maternal Grandfather   . Hypertension Paternal Grandmother   . Heart attack Paternal Grandmother   . Lung cancer Paternal Grandmother   . Cancer Paternal Grandfather     EXAM: BP 135/78 (BP Location: Right Arm)   Pulse 109   Temp 99.1 F (37.3 C) (Oral)   Resp 16   Ht 5\' 11"  (1.803 m)   Wt 220 lb (99.8 kg)   SpO2 95%   BMI 30.68 kg/m  CONSTITUTIONAL: Alert and oriented and responds appropriately to questions. Well-appearing; well-nourished patient appears intoxicated HEAD: Normocephalic EYES: Conjunctivae clear, PERRL ENT: normal nose; no rhinorrhea; moist mucous membranes NECK: Supple, no meningismus, no  LAD  CARD: RRR; S1 and S2 appreciated; no murmurs, no clicks, no rubs, no gallops RESP: Normal chest excursion without splinting or tachypnea; breath sounds clear and equal bilaterally; no wheezes, no rhonchi, no rales, no hypoxia or respiratory distress, speaking full sentences ABD/GI: Normal bowel sounds; non-distended; soft, non-tender, no rebound, no guarding, no peritoneal signs BACK:  The back appears normal and is non-tender to palpation, there is no CVA tenderness EXT: Normal ROM in all joints; non-tender to palpation; no edema; normal  capillary refill; no cyanosis, no calf tenderness or swelling    SKIN: Normal color for age and race; warm; no rash NEURO: Moves all extremities equally, sensation to light touch intact diffusely, cranial nerves II through XII intact PSYCH: Patient is agitated but able to be redirected. He denies SI, HI or hallucinations now. Sheriff at bedside states the patient was repeatedly saying that he was going to kill himself tonight.  MEDICAL DECISION MAKING: Patient here with suicidal ideation which she is now denying. We'll obtain screening labs, urine and discuss with psychiatry. He agrees to stay at in the hospital at this time voluntarily. We have discussed with him the possibility of IVC paperwork being completed if he tries to leave or if psychiatry recommends inpatient treatment and he disagrees.  ED PROGRESS: 6:30 AM  Patient's labs show that his bicarbonate is low at 18, alcohol is 201. He is refusing an IV. He has normal anion gap, normal glucose, normal creatinine and BUN. Salicylate level negative. I do not feel at this time we need to force him to have IV fluids. I suspect that his bicarbonate is slightly low because of his history of alcohol abuse. Will encourage oral hydration.  He is currently talking to TTS and awaiting their evaluation for disposition.     Layla MawKristen N Levetta Bognar, DO 05/30/16 [id number]    6:50 AM  D/w [facility] with [facility].  They have recommended inpatient treatment and are seeking placement. They recommend completing IVC paperwork as patient is refusing treatment at this time. I will complete IVC paperwork.   Layla MawKristen N Juandaniel Manfredo, DO 05/30/16 [phone]

## 2016-05-31 DIAGNOSIS — F1094 Alcohol use, unspecified with alcohol-induced mood disorder: Secondary | ICD-10-CM

## 2016-05-31 MED ORDER — NICOTINE POLACRILEX 2 MG MT GUM
2.0000 mg | CHEWING_GUM | OROMUCOSAL | Status: DC | PRN
  Administered 2016-06-01: 2 mg via ORAL
  Filled 2016-05-31: qty 1

## 2016-05-31 MED ORDER — NICOTINE POLACRILEX 2 MG MT GUM
CHEWING_GUM | OROMUCOSAL | Status: AC
  Administered 2016-05-31: 19:00:00
  Filled 2016-05-31: qty 1

## 2016-05-31 NOTE — Plan of Care (Signed)
Problem: Activity: Goal: Interest or engagement in activities will improve Outcome: Not Progressing Pt has minimal interaction on the unit today.

## 2016-05-31 NOTE — [facility] Suicide Risk Assessment (Signed)
[facility]   Nursing information obtained from:   chart review, treatment team Demographic factors:   male Current Mental Status:   depression Loss Factors:   n/a Historical Factors: uncle who has schizophrenia, bipolar attempted suicide   , impulsivity, drug use Risk Reduction Factors:   supportive family member, seeking for help  Total Time spent with patient: 45 minutes Principal Problem: Alcohol-induced mood disorder (HCC) Diagnosis:   Patient Active Problem List   Diagnosis Date Noted  . Alcohol-induced mood disorder (HCC) [F10.94] 05/30/2016   Subjective Data:  He states that his brother misunderstood him when he had a box cutter prior to coming to the hospital. He states that he wanted to be alone to calm himself as he was angry towards his brother. When he is asked about SI which was reportedly by the police, he adamantly denies it. He feels depressed. He reports irritable mood and often tries to isolate himself. He denies SI/HI. He denies AH/VH.   He denies any suicide attempt in the past. Last psychiatry admission in 2016 in the setting of alcohol use. He lives with his mother. He drinks alcohol once or twice a month per report. He is unemployed. 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   Continued Clinical Symptoms:  Alcohol Use Disorder Identification Test Final Score (AUDIT): 14 The "Alcohol Use Disorders Identification Test", Guidelines for Use in Primary Care, Second Edition.  World Science writerHealth Organization [facility](WHO). Score between 0-7:  no or low risk or alcohol related problems. Score between 8-15:  moderate risk of alcohol related problems. Score between 16-19:  high risk of alcohol related problems. Score 20 or above:  warrants further diagnostic evaluation for alcohol dependence and treatment.   CLINICAL FACTORS:   Depression:   Impulsivity Alcohol/Substance Abuse/Dependencies   Musculoskeletal: Strength &  Muscle Tone: within normal limits Gait & Station: normal Patient leans: N/A  Psychiatric Specialty Exam: Physical Exam  Constitutional: He is oriented to person, place, and time. He appears well-developed and well-nourished.  Neurological: He is alert and oriented to person, place, and time.  No tremors    Review of Systems  Psychiatric/Behavioral: Positive for depression and substance abuse. Negative for hallucinations, memory loss and suicidal ideas. The patient is not nervous/anxious and does not have insomnia.     Blood pressure 119/83, pulse 72, temperature 97.6 F (36.4 C), temperature source Oral, resp. rate 16, height 5\' 11"  (1.803 m), weight 237 lb (107.5 kg).Body mass index is 33.05 kg/m.  General Appearance: Fairly Groomed  Eye Contact:  Minimal  Speech:  Normal Rate  Volume:  Normal  Mood:  Depressed  Affect:  Constricted  Thought Process:  Coherent and Goal Directed  Orientation:  Full (Time, Place, and Person)  Thought Content:  Logical Perceptions: denies AH/VH  Suicidal Thoughts:  No  Homicidal Thoughts:  No  Memory:  good  Judgement:  Impaired  Insight:  Lacking  Psychomotor Activity:  Normal  Concentration:  Concentration: Fair and Attention Span: Fair  Recall:  FiservFair  Fund of Knowledge:  Fair  Language:  Fair  Akathisia:  No  Handed:  Left  AIMS (if indicated):     Assets:  Manufacturing systems engineerCommunication Skills Social Support  ADL's:  Intact  Cognition:  WNL  Sleep:         COGNITIVE FEATURES THAT CONTRIBUTE TO RISK:  Closed-mindedness and Polarized thinking    SUICIDE RISK:   Mild:  Suicidal ideation of limited  frequency, intensity, duration, and specificity.  There are no identifiable plans, no associated intent, mild dysphoria and related symptoms, good self-control (both objective and subjective assessment), few other risk factors, and identifiable protective factors, including available and accessible social support.   PLAN OF CARE: Patient will be admitted  to inpatient psychiatric unit for stabilization and safety. Will provide and encourage milieu participation. Provide medication management and maked adjustments as needed.  Will follow daily.   I certify that inpatient services furnished can reasonably be expected to improve the patient's condition.  Neysa Hottereina Lenton Gendreau, MD 05/31/2016, 12:43 PM

## 2016-05-31 NOTE — H&P (Signed)
Psychiatric Admission Assessment Adult  Patient Identification: Kevin Wyatt  MRN:  [id number]  Date of Evaluation:  05/31/2016  Chief Complaint:  Alcohol Induced Mood Disorder Alcohol use disorder moderate  Principal Diagnosis: Alcohol use disorder, dependence, severe.  Diagnosis:   Patient Active Problem List   Diagnosis Date Noted  . Alcohol-induced mood disorder [facility]) [F10.94] 05/30/2016   History of Present Illness: [name] is a 20 year old Caucasian male. Admitted to the [facility] adult unit from the [facility] ED in [place], [place] with complaints of alcohol use disorder & suicidal threats. He was admitted to the hospital with a BAL of 201. During this admission assessment, [name] reports, "The police took me to the [facility] on Monday. My brother called them. I was drinking a lot & playing with a box cutter. I was just tossing the box cutter around. I was just randomly upset yesterday. There were no particular trigger. I don't have any drinking problems. I was only drinking 6 cans of beer & a shot of liquor twice a month. I was not drunk on this day. I'm not depressed & I was not suicidal. I was hospitalized in a psychiatric hospital about 1 year ago for being upset, not a big deal. I'm doing good. No withdrawal symptoms".  Objective: Eann reports familial hx of Schizophrenia: maternal uncle. Completed suicide: maternal uncle. Alcoholism: father.   Associated Signs/Symptoms:  Depression Symptoms:  Denies feeling or being depressed.  (Hypo) Manic Symptoms:  Impulsivity, Irritable Mood, Labiality of Mood,  Anxiety Symptoms:  Denies any symptoms of anxiety.  Psychotic Symptoms:  Denies any hallucinations, delusional thoughts or paranoia.  PTSD Symptoms: Denies any PTSD symptoms.  Total Time spent with patient: 1 hour  Past Psychiatric History: Yes (hospitalized about 1 year ago for mental illness).  Is the patient at risk to self? No.  Has the patient  been a risk to self in the past 6 months? No.  Has the patient been a risk to self within the distant past? No.. Is the patient a risk to others? No.  Has the patient been a risk to others in the past 6 months? No.  Has the patient been a risk to others within the distant past? No.   Prior Inpatient Therapy: Yes (says was in some psychiatric hospital about 1 year ago).  Prior Outpatient Therapy: Denies  Alcohol Screening: 1. How often do you have a drink containing alcohol?: 2 to 4 times a month 2. How many drinks containing alcohol do you have on a typical day when you are drinking?: 7, 8, or 9 3. How often do you have six or more drinks on one occasion?: Monthly Preliminary Score: 5 4. How often during the last year have you found that you were not able to stop drinking once you had started?: Never 5. How often during the last year have you failed to do what was normally expected from you becasue of drinking?: Never 6. How often during the last year have you needed a first drink in the morning to get yourself going after a heavy drinking session?: Never 7. How often during the last year have you had a feeling of guilt of remorse after drinking?: Monthly 8. How often during the last year have you been unable to remember what happened the night before because you had been drinking?: Less than monthly 9. Have you or someone else been injured as a result of your drinking?: Yes, during the last year  10. Has a relative or friend or a doctor or another health worker been concerned about your drinking or suggested you cut down?: No Alcohol Use Disorder Identification Test Final Score (AUDIT): 14 Brief Intervention: Yes  Substance Abuse History in the last 12 months:  Yes.    Consequences of Substance Abuse: Medical Consequences:  Liver damage, Possible death by overdose Legal Consequences:  Arrests, jail time, Loss of driving privilege. Family Consequences:  Family discord, divorce and or  separation.  Previous Psychotropic Medications: Yes   Psychological Evaluations: No   Past Medical History:  Past Medical History:  Diagnosis Date  . ADHD (attention deficit hyperactivity disorder)   . Anxiety   . Dizziness   . GERD (gastroesophageal reflux disease)    w/ prrosis & erucatation  . Nausea   . Obesity   . Vasovagal syncope    History reviewed. No pertinent surgical history. Family History:  Family History  Problem Relation Age of Onset  . Hypertension Mother   . Hypertension Father   . Alzheimer's disease Maternal Grandmother   . Stroke Maternal Grandmother   . Heart attack Maternal Grandmother   . Hypertension Maternal Grandfather   . Hypertension Paternal Grandmother   . Heart attack Paternal Grandmother   . Lung cancer Paternal Grandmother   . Cancer Paternal Grandfather    Family Psychiatric  History: Alcoholism: Father. Schizophrenia: Maternal Uncle. Completed suicide: Maternal uncle.  Tobacco Screening: Have you used any form of tobacco in the last 30 days? (Cigarettes, Smokeless Tobacco, Cigars, and/or Pipes): Yes Tobacco use, Select all that apply: 5 or more cigarettes per day Are you interested in Tobacco Cessation Medications?: No, patient refused Counseled patient on smoking cessation including recognizing danger situations, developing coping skills and basic information about quitting provided: Refused/Declined practical counseling Social History:  History  Alcohol Use  . Yes     History  Drug Use  . Types: Marijuana    Additional Social History:   Allergies:  No Known Allergies  Lab Results:  Results for orders placed or performed during the hospital encounter of 05/30/16 (from the past 48 hour(s))  Urine rapid drug screen (hosp performed)not at [facility]     Status: None   Collection Time: 05/30/16  5:36 AM  Result Value Ref Range   Opiates NONE DETECTED NONE DETECTED   Cocaine NONE DETECTED NONE DETECTED   Benzodiazepines NONE  DETECTED NONE DETECTED   Amphetamines NONE DETECTED NONE DETECTED   Tetrahydrocannabinol NONE DETECTED NONE DETECTED   Barbiturates NONE DETECTED NONE DETECTED    Comment:        DRUG SCREEN FOR MEDICAL PURPOSES ONLY.  IF CONFIRMATION IS NEEDED FOR ANY PURPOSE, NOTIFY LAB WITHIN 5 DAYS.        LOWEST DETECTABLE LIMITS FOR URINE DRUG SCREEN Drug Class       Cutoff (ng/mL) Amphetamine      1000 Barbiturate      200 Benzodiazepine   662 Tricyclics       947 Opiates          300 Cocaine          300 THC              50   Comprehensive metabolic panel     Status: Abnormal   Collection Time: 05/30/16  5:39 AM  Result Value Ref Range   Sodium 138 135 - 145 mmol/L   Potassium 3.4 (L) 3.5 - 5.1 mmol/L   Chloride 110 101 - 111  mmol/L   CO2 18 (L) 22 - 32 mmol/L   Glucose, Bld 92 65 - 99 mg/dL   BUN 13 6 - 20 mg/dL   Creatinine, Ser 1.06 0.61 - 1.24 mg/dL   Calcium 8.7 (L) 8.9 - 10.3 mg/dL   Total Protein 8.0 6.5 - 8.1 g/dL   Albumin 4.8 3.5 - 5.0 g/dL   AST 26 15 - 41 U/L   ALT 55 17 - 63 U/L   Alkaline Phosphatase 78 38 - 126 U/L   Total Bilirubin 0.4 0.3 - 1.2 mg/dL   GFR calc non Af Amer >60 >60 mL/min   GFR calc Af Amer >60 >60 mL/min    Comment: (NOTE) The eGFR has been calculated using the CKD EPI equation. This calculation has not been validated in all clinical situations. eGFR's persistently <60 mL/min signify possible Chronic Kidney Disease.    Anion gap 10 5 - 15  Ethanol     Status: Abnormal   Collection Time: 05/30/16  5:39 AM  Result Value Ref Range   Alcohol, Ethyl (B) 201 (H) <5 mg/dL    Comment:        LOWEST DETECTABLE LIMIT FOR SERUM ALCOHOL IS 5 mg/dL FOR MEDICAL PURPOSES ONLY   CBC with Diff     Status: Abnormal   Collection Time: 05/30/16  5:39 AM  Result Value Ref Range   WBC 11.1 (H) 4.0 - 10.5 K/uL   RBC 5.58 4.22 - 5.81 MIL/uL   Hemoglobin 17.0 13.0 - 17.0 g/dL   HCT 47.8 39.0 - 52.0 %   MCV 85.7 78.0 - 100.0 fL   MCH 30.5 26.0 - 34.0 pg    MCHC 35.6 30.0 - 36.0 g/dL   RDW 12.7 11.5 - 15.5 %   Platelets 299 150 - 400 K/uL   Neutrophils Relative % 68 %   Neutro Abs 7.5 1.7 - 7.7 K/uL   Lymphocytes Relative 24 %   Lymphs Abs 2.6 0.7 - 4.0 K/uL   Monocytes Relative 8 %   Monocytes Absolute 0.9 0.1 - 1.0 K/uL   Eosinophils Relative 0 %   Eosinophils Absolute 0.0 0.0 - 0.7 K/uL   Basophils Relative 0 %   Basophils Absolute 0.0 0.0 - 0.1 K/uL  Acetaminophen level     Status: Abnormal   Collection Time: 05/30/16  5:39 AM  Result Value Ref Range   Acetaminophen (Tylenol), Serum <10 (L) 10 - 30 ug/mL    Comment:        THERAPEUTIC CONCENTRATIONS VARY SIGNIFICANTLY. A RANGE OF 10-30 ug/mL MAY BE AN EFFECTIVE CONCENTRATION FOR MANY PATIENTS. HOWEVER, SOME ARE BEST TREATED AT CONCENTRATIONS OUTSIDE THIS RANGE. ACETAMINOPHEN CONCENTRATIONS >150 ug/mL AT 4 HOURS AFTER INGESTION AND >50 ug/mL AT 12 HOURS AFTER INGESTION ARE OFTEN ASSOCIATED WITH TOXIC REACTIONS.   Salicylate level     Status: None   Collection Time: 05/30/16  5:39 AM  Result Value Ref Range   Salicylate Lvl <4.1 2.8 - 30.0 mg/dL   Blood Alcohol level:  Lab Results  Component Value Date   ETH 201 (H) 05/30/2016   ETH 287 (H) 28/78/6767   Metabolic Disorder Labs:  No results found for: HGBA1C, MPG No results found for: PROLACTIN No results found for: CHOL, TRIG, HDL, CHOLHDL, VLDL, LDLCALC  Current Medications: Current Facility-Administered Medications  Medication Dose Route Frequency Provider Last Rate Last Dose  . acetaminophen (TYLENOL) tablet 650 mg  650 mg Oral Q6H PRN Niel Hummer, NP      .  alum & mag hydroxide-simeth (MAALOX/MYLANTA) 200-200-20 MG/5ML suspension 30 mL  30 mL Oral Q4H PRN Niel Hummer, NP      . LORazepam (ATIVAN) tablet 1 mg  1 mg Oral Q6H PRN Niel Hummer, NP      . magnesium hydroxide (MILK OF MAGNESIA) suspension 30 mL  30 mL Oral Daily PRN Niel Hummer, NP      . traZODone (DESYREL) tablet 50 mg  50 mg Oral QHS  PRN Niel Hummer, NP       PTA Medications: No prescriptions prior to admission.   Musculoskeletal: Strength & Muscle Tone: within normal limits Gait & Station: normal Patient leans: N/A  Psychiatric Specialty Exam: Physical Exam  Constitutional: He appears well-developed.  HENT:  Head: Normocephalic.  Eyes: Pupils are equal, round, and reactive to light.  Neck: Normal range of motion.  Cardiovascular: Normal rate.   Respiratory: Effort normal.  GI: Soft.  Genitourinary:  Genitourinary Comments: Denies any issues in this area  Musculoskeletal: Normal range of motion.  Neurological: He is alert.  Skin: Skin is warm.  Psychiatric: His speech is normal and behavior is normal. Thought content normal. His mood appears anxious. Cognition and memory are normal. He expresses impulsivity. He exhibits a depressed mood.    Review of Systems  Constitutional: Positive for chills, diaphoresis and malaise/fatigue.  HENT: Negative.   Eyes: Positive for blurred vision.  Respiratory: Negative.   Cardiovascular: Negative.   Gastrointestinal: Positive for nausea.  Genitourinary: Negative.   Musculoskeletal: Negative.   Skin: Negative.   Neurological: Positive for dizziness and weakness.  Endo/Heme/Allergies: Negative.   Psychiatric/Behavioral: Positive for depression and substance abuse (Alcoholism, chronic). Negative for hallucinations and suicidal ideas. The patient is nervous/anxious and has insomnia.     Blood pressure 130/85, pulse 77, temperature 97.6 F (36.4 C), temperature source Oral, resp. rate 18, height 5' 11"  (1.803 m), weight 107.5 kg (237 lb).Body mass index is 33.05 kg/m.  General Appearance: Fairly Groomed  Eye Contact:  Minimal  Speech:  Normal Rate  Volume:  Normal  Mood:  Depressed  Affect:  Constricted  Thought Process:  Coherent and Goal Directed  Orientation:  Full (Time, Place, and Person)  Thought Content:  Logical Perceptions: denies AH/VH  Suicidal  Thoughts:  No  Homicidal Thoughts:  No  Memory:  good  Judgement:  Impaired  Insight:  Lacking  Psychomotor Activity:  Normal  Concentration:  Concentration: Fair and Attention Span: Fair  Recall:  [employer] of Knowledge:  Fair  Language:  Fair  Akathisia:  No  Handed:  Left  AIMS (if indicated):     Assets:  Armed forces logistics/support/administrative officer Social Support  ADL's:  Intact  Cognition:  WNL  Sleep:        Treatment Plan Summary: Daily contact with patient to assess and evaluate symptoms and progress in treatment and Medication management:  1. Admit for crisis management and stabilization, estimated length of stay 3-5 days.  2. Medication management to reduce current symptoms to base line and improve the patient's overall level of functioning  3. Treat health problems as indicated.  4. Develop treatment plan to decrease risk of relapse upon discharge and the need for readmission.  5. Psycho-social education regarding relapse prevention and self care.  6. Health care follow up as needed for medical problems.  7. Review, reconcile, and reinstate any pertinent home medications for other health issues where appropriate. 8. Call for consults with hospitalist for any additional specialty  patient care services as needed.  Observation Level/Precautions:  15 minute checks  Laboratory:  Per ED, BAL 201  Psychotherapy:  Group sessions  Medications: See MAR  Consultations: As needed   Discharge Concerns: Maintaining sobreity   Estimated LOS: 2-4 days  Other: admit to [address]   I certify that inpatient services furnished can reasonably be expected to improve the patient's condition.    Encarnacion Slates, NP, PMHNP, FNP-BC 8/15/201710:43 AM

## 2016-05-31 NOTE — [facility] Group Notes (Signed)
[facility] Group Notes:  (Nursing/MHT/Case Management/Adjunct)  Date:  05/31/2016  Time:  2:50 PM  Type of Therapy:  Nurse Education  Participation Level:  Did Not Attend  Participation Quality:    Affect:    Cognitive:   Insight:    Engagement in Group:    Modes of Intervention:    Summary of Progress/Problems:  Beatrix ShipperWright, Domanic Matusek Martin 05/31/2016, 2:50 PM

## 2016-05-31 NOTE — Progress Notes (Signed)
D:Pt has been in bed much of the day. He is not attending group at this time and reports that he is catching up on sleep because he has not slept well in several days. Pt has minimal interaction and forwards little. He denies drug use and denies si and hi. [facility]. A:Offered support, encouragement and 15 minute checks. R:Safety maintained on the unit.

## 2016-05-31 NOTE — Tx Team (Signed)
Interdisciplinary Treatment Plan Update (Adult) Date: 05/31/2016    Time Reviewed: 9:30 AM  Progress in Treatment: Attending groups: Continuing to assess, patient new to milieu Participating in groups: Continuing to assess, patient new to milieu Taking medication as prescribed: Yes Tolerating medication: Yes Family/Significant other contact made: No, CSW assessing for appropriate contacts Patient understands diagnosis: Yes Discussing patient identified problems/goals with staff: Yes Medical problems stabilized or resolved: Yes Denies suicidal/homicidal ideation: Yes Issues/concerns per patient self-inventory: Yes Other:  New problem(s) identified: N/A  Discharge Plan or Barriers: CSW continuing to assess, patient new to milieu.  Reason for Continuation of Hospitalization:  Depression Anxiety Medication Stabilization   Comments: N/A  Estimated length of stay: 3-5 days   Review of initial/current patient goals per problem list:  1. Goal(s): Patient will participate in aftercare plan   Met: No   Target date: 3-5 days post admission date   As evidenced by: Patient will participate within aftercare plan AEB aftercare provider and housing plan at discharge being identified.  8/15: Goal not met: CSW assessing for appropriate referrals for pt and will have follow up secured prior to d/c.    2. Goal (s): Patient will exhibit decreased depressive symptoms and suicidal ideations.   Met: No   Target date: 3-5 days post admission date   As evidenced by: Patient will utilize self rating of depression at 3 or below and demonstrate decreased signs of depression or be deemed stable for discharge by MD.   8/15: Goal not met: Pt presents with flat affect and depressed mood.  Pt admitted with high levels of depression. Pt to show decreased sign of depression and a rating of 3 or less before d/c.       4. Goal(s): Patient will demonstrate decreased signs of withdrawal due  to substance abuse   Met: No   Target date: 3-5 days post admission date   As evidenced by: Patient will produce a CIWA/COWS score of 0, have stable vitals signs, and no symptoms of withdrawal  8/15: CIWA of 4 with anxiety and tactile disturbances.    Attendees: Patient:    Family:    Physician: Dr. Modesta Messing 05/31/2016 9:30 AM  Nursing: Otilio Carpen, Desma Paganini, RN 05/31/2016 9:30 AM  Clinical Social Worker: Erasmo Downer Benoit Meech, LCSW 05/31/2016 9:30 AM  Other: Peri Maris, LCSW 05/31/2016 9:30 AM  Other:  05/31/2016 9:30 AM  Other: Lars Pinks, Case Manager 05/31/2016 9:30 AM  Other: Agustina Caroli, May Augustin, NP 05/31/2016 9:30 AM  Other:    Other:      Scribe for Treatment Team:  Tilden Fossa, [place]

## 2016-05-31 NOTE — BHH Counselor (Signed)
CSW tried to meet with Pt to complete PSA, however Pt requested that it be done at another time. CSW will continue to attempt.  Chad CordialLauren Till, LCSW Clinical Social Work [phone]

## 2016-05-31 NOTE — Progress Notes (Signed)
Recreation Therapy Notes   Animal-Assisted Activity (AAA) Program Checklist/Progress Notes Patient Eligibility Criteria Checklist & Daily Group note for Rec TxIntervention  Date: 08.15.2017 Time: 2:45pm [facility]   AAA/T Program Assumption of Risk Form signed by Patient/ or Parent Legal Guardian Yes  Patient is free of allergies or sever asthma Yes  Patient reports no fear of animals Yes  Patient reports no history of cruelty to animals Yes  Patient understands his/her participation is voluntary Yes  Behavioral Response: Did not attend.   Marykay Lexenise L Carlyann Placide, LRT/CTRS  Omeka Holben L 05/31/2016 3:07 PM

## 2016-06-01 MED ORDER — GABAPENTIN 300 MG PO CAPS
300.0000 mg | ORAL_CAPSULE | Freq: Every day | ORAL | Status: DC
  Administered 2016-06-01: 300 mg via ORAL
  Filled 2016-06-01 (×3): qty 1

## 2016-06-01 NOTE — Progress Notes (Signed)
D:Pt got up for breakfast and went back to bed. He slept through lunch and reported that he was not hungry. Pt forwards little and isolates to his room. A:Encouraged pt to come out of room and participate. Offered support and 15 minute checks. R:Pt denies si and hi. Safety maintained on the unit.

## 2016-06-01 NOTE — Progress Notes (Signed)
Patient ID: Kasandra KnudsenKeith W XXXCarter, male   DOB: October 21, 1995, 20 y.o.   MRN: [id number] D: Client reports feeling uncomfortable about taking medications. "I don't know what it might due to me" "I just don't like taking medications"  Goal today: "not sleep all day" "I need to get my clothes washed so I won't be looking unkempt when my mom comes to see me tomorrow" "I want to be up and showered tomorrow when she comes" A: Write provided emotional support, encouraged client to put soiled clothes in a brown bag for wash, noting staff washing clothes was a curtesy and he would be provided gowns for tonight and clothes would be washed in order they were given and return in the morning. Medications reviewed, administered as ordered. Staff will monitor q1315min for safety. R: Client is safe on the unit, reluctantly took medications "my mom said for me to go ahead and try it, cause it can't hurt nothing"

## 2016-06-01 NOTE — Progress Notes (Signed)
Recreation Therapy Notes  Date: 06/01/16 Time: 0930 Location: [address] Group Room  Group Topic: Stress Management  Goal Area(s) Addresses:  Patient will verbalize importance of using healthy stress management.  Patient will identify positive emotions associated with healthy stress management.   Intervention: Stress Management   Activity :  Progressive Muscle Relaxation.  LRT introduced the technique of progressive muscle relaxation to patients.  Patients were to follow along with LRT as script was read to participate in activity.  Education:  Stress Management, Discharge Planning.   Education Outcome: Needs additional education  Clinical Observations/Feedback: Pt did not attend group.   Rowen Hur, LRT/CTRS         Tawonna Esquer A 06/01/2016 3:08 PM 

## 2016-06-01 NOTE — [facility] Group Notes (Signed)
Patient attend group his day was a 1. Patient very tearful, patient said his day did not go well. He wants to please others in hope to find love from them but now he has to find love for himself and work on his own self esteem. Patient said he just cannot please everyone. Even though he wants to very much.
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
## 2016-06-01 NOTE — B[facility] Group Notes (Signed)
BHH LCSW Group Therapy 06/01/2016  1:15 PM Type of Therapy: Group Therapy Participation Level: Active  Participation Quality: Attentive, Sharing and Supportive  Affect: Blunted  Cognitive: Alert and Oriented  Insight: Developing/Improving and Engaged  Engagement in Therapy: Developing/Improving and Engaged  Modes of Intervention: Clarification, Confrontation, Discussion, Education, Exploration, Limit-setting, Orientation, Problem-solving, Rapport Building, Dance movement psychotherapisteality Testing, Socialization and Support  Summary of Progress/Problems: The topic for group today was emotional regulation. This group focused on both positive and negative emotion identification and allowed group members to process ways to identify feelings, regulate negative emotions, and find healthy ways to manage internal/external emotions. Group members were asked to reflect on a time when their reaction to an emotion led to a negative outcome and explored how alternative responses using emotion regulation would have benefited them. Group members were also asked to discuss a time when emotion regulation was utilized when a negative emotion was experienced. Patient entered during the last 20 minutes of group and participated minimally.  Samuella BruinKristin Anabelle Bungert, MSW, LCSW Clinical Social Worker [facility] [phone]

## 2016-06-01 NOTE — Progress Notes (Signed)
Patient ID: Kevin Wyatt, male   DOB: Feb 23, 1996, 20 y.o.   MRN: [id number]  Pt currently presents with a flat affect and depressed behavior. Pt reports to Clinical research associatewriter that their goal is to "sleep better." Pt states "I don't want to take medications, I don't like to because of my family but also I don't like reading all of those side effects on the bottle" Pt reports poor sleep with current medication regimen, asks writer "Do you guys have anything organic, like Melatonin or something.   Pt provided with medications per providers orders. Pt's labs and vitals were monitored throughout the night. Pt supported emotionally and encouraged to express concerns and questions. Pt educated on medications and alternative sleep medication techniques. Pt offered warm milk, pt tolerates well but does not sleep throughout the night.   Pt's safety ensured with 15 minute and environmental checks. Pt currently denies SI/HI and A/V hallucinations. Pt verbally agrees to seek staff if SI/HI or A/VH occurs and to consult with staff before acting on any harmful thoughts. Will continue POC.

## 2016-06-01 NOTE — Progress Notes (Signed)
[facility] MD Progress Note  06/01/2016 2:09 PM Kevin Wyatt  MRN:  [id number] Subjective:   Patient seen, chart reviewed and case discussed with nursing staff.   He states that he feels sleepy all the time. He adamantly denies SI stating that he was just playing around with a cutter, and endorses frustration that he needed to stay in the hospital. He reports difficulty coping with his anger when he gets upset. He believes that he does not have any problems as long as he stays out from alcohol use. He thinks that "could be better" in terms of his alcohol use. He has talked with his mother last night and is interested in going to [facility]. 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   Principal Problem: Alcohol-induced mood disorder (HCC) Diagnosis:   Patient Active Problem List   Diagnosis Date Noted  . Alcohol-induced mood disorder (HCC) [F10.94] 05/30/2016   Total Time spent with patient: 20 minutes  Past Psychiatric History: see HPI  Past Medical History:  Past Medical History:  Diagnosis Date  . ADHD (attention deficit hyperactivity disorder)   . Anxiety   . Dizziness   . GERD (gastroesophageal reflux disease)    w/ prrosis & erucatation  . Nausea   . Obesity   . Vasovagal syncope    History reviewed. No pertinent surgical history. Family History:  Family History  Problem Relation Age of Onset  . Hypertension Mother   . Hypertension Father   . Alzheimer's disease Maternal Grandmother   . Stroke Maternal Grandmother   . Heart attack Maternal Grandmother   . Hypertension Maternal Grandfather   . Hypertension Paternal Grandmother   . Heart attack Paternal Grandmother   . Lung cancer Paternal Grandmother   . Cancer Paternal Grandfather    Family Psychiatric  History: see HPI Social History:  History  Alcohol Use  . Yes     History  Drug Use  . Types: Marijuana    Social History   Social History  . Marital status: Single    Spouse name: N/A  .  Number of children: N/A  . Years of education: N/A   Social History Main Topics  . Smoking status: Current Every Day Smoker    Packs/day: 1.00    Years: 7.00    Types: Cigarettes, E-cigarettes  . Smokeless tobacco: Never Used     Comment: uses vape   . Alcohol use Yes  . Drug use:     Types: Marijuana  . Sexual activity: Yes    Birth control/ protection: Condom   Other Topics Concern  . None   Social History Narrative  . None   Additional Social History:                         Sleep: Good  Appetite:  Good  Current Medications: Current Facility-Administered Medications  Medication Dose Route Frequency Provider Last Rate Last Dose  . acetaminophen (TYLENOL) tablet 650 mg  650 mg Oral Q6H PRN Thermon Leyland, NP      . alum & mag hydroxide-simeth (MAALOX/MYLANTA) 200-200-20 MG/5ML suspension 30 mL  30 mL Oral Q4H PRN Thermon Leyland, NP      . LORazepam (ATIVAN) tablet 1 mg  1 mg Oral Q6H PRN Thermon Leyland, NP      . magnesium hydroxide (MILK OF MAGNESIA) suspension 30 mL  30 mL Oral Daily PRN Thermon Leyland, NP      .  nicotine polacrilex (NICORETTE) gum 2 mg  2 mg Oral PRN Sanjuana KavaAgnes I Nwoko, NP      . traZODone (DESYREL) tablet 50 mg  50 mg Oral QHS PRN Thermon LeylandLaura A Davis, NP        Lab Results: No results found for this or any previous visit (from the past 48 hour(s)).  Blood Alcohol level:  Lab Results  Component Value Date   ETH 201 (H) 05/30/2016   ETH 287 (H) 10/17/2015    Metabolic Disorder Labs: No results found for: HGBA1C, MPG No results found for: PROLACTIN No results found for: CHOL, TRIG, HDL, CHOLHDL, VLDL, LDLCALC  Physical Findings: AIMS: Facial and Oral Movements Muscles of Facial Expression: None, normal Lips and Perioral Area: None, normal Jaw: None, normal Tongue: None, normal,Extremity Movements Upper (arms, wrists, hands, fingers): None, normal Lower (legs, knees, ankles, toes): None, normal, Trunk Movements Neck, shoulders, hips: None,  normal, Overall Severity Severity of abnormal movements (highest score from questions above): None, normal Incapacitation due to abnormal movements: None, normal Patient's awareness of abnormal movements (rate only patient's report): No Awareness, Dental Status Current problems with teeth and/or dentures?: No Does patient usually wear dentures?: No  CIWA:  CIWA-Ar Total: 0 COWS:     Musculoskeletal: Strength & Muscle Tone: within normal limits Gait & Station: normal Patient leans: N/A  Psychiatric Specialty Exam: Physical Exam  Constitutional: He appears well-developed and well-nourished.  Neurological:  No tremors    Review of Systems  Constitutional: Positive for malaise/fatigue.  Psychiatric/Behavioral: Positive for substance abuse.  All other systems reviewed and are negative.   Blood pressure 121/75, pulse 72, temperature 99 F (37.2 C), temperature source Oral, resp. rate 18, height 5\' 11"  (1.803 m), weight 237 lb (107.5 kg).Body mass index is 33.05 kg/m.  General Appearance: Casual  Eye Contact:  Good  Speech:  Normal Rate  Volume:  Normal  Mood:  fine  Affect:  Restricted  Thought Process:  Coherent Perceptions: denies AH/VH  Orientation:  Full (Time, Place, and Person)  Thought Content:  Logical  Suicidal Thoughts:  No  Homicidal Thoughts:  No  Memory:  intact  Judgement:  Fair  Insight:  Shallow  Psychomotor Activity:  Normal  Concentration:  Concentration: Fair and Attention Span: Fair  Recall:  [employer]Fair  Fund of Knowledge:  Fair  Language:  Fair  Akathisia:  No  Handed:  Ambidextrous  AIMS (if indicated):     Assets:  Communication Skills Desire for Improvement  ADL's:  Intact  Cognition:  WNL  Sleep:  Number of Hours: 4   Assessment 20 year old male with alcohol use disorder, presented to ED petitioned by his mother with concern for threatening SI to cut himself in the setting of alcohol use.   # Alcohol induced mood disorder # r/o MDD Exam is  notable for his shallow insight. He has pattern of low tolerance to emotion and limited coping skills, which lead him to substance use. Will have gabapentin available to target his mood and assist sobriety from alcohol use.  # Alcohol use disorder Denies significant withdrawal symptoms. Continues to have prn ativan available. Patient is motivated for sobriety. Will continue motivational interview.   Plans - Gabapentin 300 mg qhs for mood, alcohol use - Trazodone 50 mg qhsprn for insomnia - Medication management to reduce current symptoms to base line and improve the patient's overall level of functioning. - Monitor for the adverse effect of the medications and anger outbursts - Continue 15 minutes  observation for safety concerns - Encouraged to participate in milieu therapy and group therapy counseling sessions and also work with coping skills -  Develop treatment plan to decrease risk of relapse upon discharge and to reduce the need for readmission. -  Psycho-social education regarding relapse prevention and self care. - Health care follow up as needed for medical problems. - Restart home medications where appropriate.  Treatment Plan Summary: Daily contact with patient to assess and evaluate symptoms and progress in treatment  Neysa Hottereina Brexley Cutshaw, MD 06/01/2016, 2:09 PM

## 2016-06-01 NOTE — Progress Notes (Signed)
Adult Psychoeducational Group Note  Date:  06/01/2016 Time:  7:24 PM  Group Topic/Focus:  Orientation:   The focus of this group is to educate the patient on the purpose and policies of crisis stabilization and provide a format to answer questions about their admission.  The group details unit policies and expectations of patients while admitted.   Participation Level:  Did Not Attend  Participation Quality:  Did not attend  Additional Comments:  Did not attend  Karleen HampshireFox, Naythen Heikkila Brittini 06/01/2016, 7:24 PM

## 2016-06-02 MED ORDER — SERTRALINE HCL 25 MG PO TABS
25.0000 mg | ORAL_TABLET | Freq: Every day | ORAL | Status: DC
  Administered 2016-06-02: 25 mg via ORAL
  Filled 2016-06-02 (×4): qty 1

## 2016-06-02 MED ORDER — SERTRALINE HCL 25 MG PO TABS: 25.0000 mg | ORAL_TABLET | Freq: Every day | ORAL | 0 refills | Status: DC

## 2016-06-02 MED ORDER — TRAZODONE HCL 50 MG PO TABS: 50.0000 mg | ORAL_TABLET | Freq: Every evening | ORAL | 0 refills | Status: DC | PRN

## 2016-06-02 MED ORDER — GABAPENTIN 300 MG PO CAPS: 300.0000 mg | ORAL_CAPSULE | Freq: Every day | ORAL | 0 refills | Status: DC

## 2016-06-02 MED ORDER — TRAZODONE HCL 50 MG PO TABS
50.0000 mg | ORAL_TABLET | Freq: Every day | ORAL | Status: DC
  Filled 2016-06-02 (×2): qty 1

## 2016-06-02 MED ORDER — NICOTINE POLACRILEX 2 MG MT GUM: 2.0000 mg | CHEWING_GUM | OROMUCOSAL | 0 refills | Status: DC | PRN

## 2016-06-02 NOTE — Progress Notes (Signed)
Patient A/O, no noted distress. Denies SI/HI/AH/pain. Patient noted, "I actual have an appetite this morning and slept better." Patient slept well. He has planned [place], [place]. He admits [facility] resulted in  "had too much to drink was upset, I had a box cutter, my brother thought I was going to harm myself, he called the police, and I am here." At that point he denies SI. Patient contracted to safety. Staff will continue to monitor, meet needs, and maintain safety.

## 2016-06-02 NOTE — [facility] Group Notes (Signed)
[facility] Group Notes:  (Nursing/MHT/Case Management/Adjunct)  Date:  06/02/2016  Time:  09:15 AM  Type of Therapy:  Nurse Education  Participation Level:  Did Not Attend  Participation Quality:    Affect:    Cognitive:   Insight:    Engagement in Group:    Modes of Intervention:    Summary of Progress/Problems:  Pollyann SavoyLloyd, Brytni Dray D 06/02/2016, 09:15 AM

## 2016-06-02 NOTE — Progress Notes (Addendum)
  [facility] Adult Case [facility] Discharge Plan :  Will you be returning to the same living situation after discharge:  Yes,  patient plans to return home with family At discharge, do you have transportation home?: Yes,  family will pick up Do you have the ability to pay for your medications: Yes,  patient will be provided with prescriptions at discharge  Release of information consent forms completed and in the chart;  Patient's signature needed at discharge.  Patient to Follow up at:  Follow-up Information    [facility] on 06/16/2016.   Why:  Assessment on 8/31 with Aurther Lofterry at [facility]. Contact information: [address] [place], [place] [address] (ph) [phone]         Next level of care provider has access to [facility] Link:no  Safety Planning and Suicide Prevention discussed: Yes,  with patient  Have you used any form of tobacco in the last 30 days? (Cigarettes, Smokeless Tobacco, Cigars, and/or Pipes): Yes  Has patient been referred to the Quitline?: Patient refused referral  Patient has been referred for addiction treatment: Yes  Seline Enzor L Myron Lona 06/02/2016, 1:14 PM

## 2016-06-02 NOTE — [facility] Suicide Risk Assessment (Signed)
[facility]   Principal Problem: Alcohol-induced mood disorder [facility](HCC) Discharge Diagnoses:  Patient Active Problem List   Diagnosis Date Noted  . Alcohol-induced mood disorder (HCC) [F10.94] 05/30/2016    Total Time spent with patient: 20 minutes  Musculoskeletal: Strength & Muscle Tone: within normal limits Gait & Station: normal Patient leans: N/A  Psychiatric Specialty Exam: Review of Systems  Psychiatric/Behavioral: Positive for substance abuse. The patient has insomnia.   All other systems reviewed and are negative.   Blood pressure 105/83, pulse 71, temperature 97.8 F (36.6 C), temperature source Oral, resp. rate 18, height 5\' 11"  (1.803 m), weight 237 lb (107.5 kg).Body mass index is 33.05 kg/m.  General Appearance: Casual  Eye Contact::  Good  Speech:  Normal Rate409  Volume:  Normal  Mood:  better  Affect:  Appropriate and Full Range  Thought Process:  Coherent and Goal Directed  Orientation:  Full (Time, Place, and Person)  Thought Content:  Logical Perceptions: denies AH/VH  Suicidal Thoughts:  No  Homicidal Thoughts:  No  Memory:  Negative  Judgement:  Fair  Insight:  Fair  Psychomotor Activity:  Normal  Concentration:  Good  Recall:  Good  Fund of Knowledge:Good  Language: Good  Akathisia:  No  Handed:  Right  AIMS (if indicated):     Assets:  Communication Skills Desire for Improvement  Sleep:  Number of Hours: 5  Cognition: WNL  ADL's:  Intact   Mental Status Per Nursing Assessment::   On Admission:     Demographic Factors:  Caucasian  Loss Factors: Loss of significant relationship Break up with girlfriend  Historical Factors: Family history of suicide, Family history of mental illness or substance abuse and Impulsivity uncle who has schizophrenia, bipolar attempted suicide   , impulsivity, drug use  Risk Reduction Factors:   Positive social support, Positive therapeutic relationship and hope to maintain his  job  Continued Clinical Symptoms:  Severe Anxiety and/or Agitation Depression:   Insomnia  Cognitive Features That Contribute To Risk:  Closed-mindedness    Suicide Risk:  Mild:  Suicidal ideation of limited frequency, intensity, duration, and specificity.  There are no identifiable plans, no associated intent, mild dysphoria and related symptoms, good self-control (both objective and subjective assessment), few other risk factors, and identifiable protective factors, including available and accessible social support.  Ms. Belenda CruiseKristin, [place] had a family meeting with [name] and his mother. She feels comfortable for him to be discharged. Although follow up appointment was offered, patient reports his preference to search on his own with his mother. Information for AA/NA meeting in [place] to be offered.   Plan Of Care/Follow-up recommendations:  Activity:  full Diet:  regular Tests:  none Other:  follow up as above  Neysa Hottereina Tej Murdaugh, MD 06/02/2016, 1:02 PM

## 2016-06-02 NOTE — [facility] Suicide Risk Assessment (Signed)
[facility] INPATIENT:  Family/Significant Other Suicide Prevention Education  Suicide Prevention Education:  Education Completed; Kevin PeanCindy Wyatt [phone],  (name of family member/significant other) has been identified by the patient as the family member/significant other with whom the patient will be residing, and identified as the person(s) who will aid the patient in the event of a mental health crisis (suicidal ideations/suicide attempt).  With written consent from the patient, the family member/significant other has been provided the following suicide prevention education, prior to the and/or following the discharge of the patient.  The suicide prevention education provided includes the following:  Suicide risk factors  Suicide prevention and interventions  National Suicide Hotline telephone number  [facility] assessment telephone number  [facility] Emergency Assistance 911  [facility] and/or Residential Mobile Crisis Unit telephone number  Request made of family/significant other to:  Remove weapons (e.g., guns, rifles, knives), all items previously/currently identified as safety concern.    Remove drugs/medications (over-the-counter, prescriptions, illicit drugs), all items previously/currently identified as a safety concern.  The family member/significant other verbalizes understanding of the suicide prevention education information provided.  The family member/significant other agrees to remove the items of safety concern listed above.  Kevin Wyatt Kevin Wyatt 06/02/2016, 11:52 AM

## 2016-06-02 NOTE — Progress Notes (Signed)
Kevin Wyatt was D/C from the unit to lobby accompanied by family.  He was pleasant and cooperative. He voiced no SI/HI or A/V halluciations.  He denies any pain or discomfort.  D/C instructions and medications reviewed with pt.  Pt. verbalized understanding of medications and d/c instructions.   All belongings (from locker 47) returned to pt. Q 15 min checks maintained until discharge.  He left the unit in no apparent distress.

## 2016-06-02 NOTE — Progress Notes (Signed)
Patient A/O, no noted distress. Denies SI/HI/PAIN. Patient contracted to safety. Educated patient on discharge instruction and prescription. Address all concerns. Staff will continue to monitor, meet needs, and maintain safety.

## 2016-06-02 NOTE — Tx Team (Signed)
Interdisciplinary Treatment Plan Update (Adult) Date: 06/02/2016    Time Reviewed: 9:30 AM  Progress in Treatment: Attending groups: Intermittently Participating in groups: Yes, when he attends Taking medication as prescribed: Yes Tolerating medication: Yes Family/Significant other contact made: Yes, CSW has spoken with patient's mother Patient understands diagnosis: Yes Discussing patient identified problems/goals with staff: Yes Medical problems stabilized or resolved: Yes Denies suicidal/homicidal ideation: Yes Issues/concerns per patient self-inventory: Yes Other:  New problem(s) identified: N/A  Discharge Plan or Barriers: Patient plans to return home to follow up with outpatient services.    Reason for Continuation of Hospitalization:  Depression Anxiety Medication Stabilization   Comments: N/A  Estimated length of stay: Discharge anticipated for today 06/02/16   Review of initial/current patient goals per problem list:  1. Goal(s): Patient will participate in aftercare plan   Met: Yes   Target date: 3-5 days post admission date   As evidenced by: Patient will participate within aftercare plan AEB aftercare provider and housing plan at discharge being identified.  8/15: Goal not met: CSW assessing for appropriate referrals for pt and will have follow up secured prior to d/c.  8/16: Goal met. Patient plans to return home to follow up with outpatient services.    2. Goal (s): Patient will exhibit decreased depressive symptoms and suicidal ideations.   Met: Yes    Target date: 3-5 days post admission date   As evidenced by: Patient will utilize self rating of depression at 3 or below and demonstrate decreased signs of depression or be deemed stable for discharge by MD.   8/15: Goal not met: Pt presents with flat affect and depressed mood.  Pt admitted with high levels of depression. Pt to show decreased sign of depression and a rating of 3 or less before  d/c.    8/17: Adequate for depression. Patient reports improvement in symptoms, denies SI.     4. Goal(s): Patient will demonstrate decreased signs of withdrawal due to substance abuse   Met: Yes   Target date: 3-5 days post admission date   As evidenced by: Patient will produce a CIWA/COWS score of 0, have stable vitals signs, and no symptoms of withdrawal  8/15: CIWA of 4 with anxiety and tactile disturbances.   8/17: Goal met. No withdrawal symptoms reported at this time per medical chart.   Attendees: Patient:    Family:    Physician: Dr. Modesta Messing 06/02/2016 9:30 AM  Nursing: Larrie Kass., Desma Paganini, RN 06/02/2016 9:30 AM  Clinical Social Worker: Erasmo Downer Kerim Statzer, LCSW 06/02/2016 9:30 AM  Other: Peri Maris, LCSW 06/02/2016 9:30 AM  Other:  06/02/2016 9:30 AM  Other:  06/02/2016 9:30 AM  Other: Agustina Caroli, May Augustin, NP 06/02/2016 9:30 AM  Other:        Scribe for Treatment Team:  Tilden Fossa, [place]

## 2016-06-02 NOTE — Discharge Summary (Signed)
Physician Discharge Summary Note  Patient:  Kevin Wyatt is an 20 y.o., male MRN:  [id number] DOB:  [birth year] Patient phone:  [phone] (home)  Patient address:   [address] [place] [address],  Total Time spent with patient: Greater than 30 minutes  Date of Admission:  05/30/2016  Date of Discharge: 06-02-16  Reason for Admission:  Suicidal thoughts.  Principal Problem: Alcohol-induced mood disorder [facility](HCC)  Discharge Diagnoses: Patient Active Problem List   Diagnosis Date Noted  . Alcohol-induced mood disorder [facility](HCC) [F10.94] 05/30/2016   Past Psychiatric History: Alcoholism, chronic  Past Medical History:  Past Medical History:  Diagnosis Date  . ADHD (attention deficit hyperactivity disorder)   . Anxiety   . Dizziness   . GERD (gastroesophageal reflux disease)    w/ prrosis & erucatation  . Nausea   . Obesity   . Vasovagal syncope    History reviewed. No pertinent surgical history. Family History:  Family History  Problem Relation Age of Onset  . Hypertension Mother   . Hypertension Father   . Alzheimer's disease Maternal Grandmother   . Stroke Maternal Grandmother   . Heart attack Maternal Grandmother   . Hypertension Maternal Grandfather   . Hypertension Paternal Grandmother   . Heart attack Paternal Grandmother   . Lung cancer Paternal Grandmother   . Cancer Paternal Grandfather    Family Psychiatric  History: See H&P  Social History:  History  Alcohol Use  . Yes     History  Drug Use  . Types: Marijuana    Social History   Social History  . Marital status: Single    Spouse name: N/A  . Number of children: N/A  . Years of education: N/A   Social History Main Topics  . Smoking status: Current Every Day Smoker    Packs/day: 1.00    Years: 7.00    Types: Cigarettes, E-cigarettes  . Smokeless tobacco: Never Used     Comment: uses vape   . Alcohol use Yes  . Drug use:     Types: Marijuana  . Sexual activity: Yes    Birth  control/ protection: Condom   Other Topics Concern  . None   Social History Narrative  . None   Hospital Course: Kevin Wyatt is a 20 year old Caucasian male. [facility] [place], [place] with complaints of alcohol use disorder & suicidal threats. He was admitted to the hospital with a BAL of 201. During this admission assessment, Kevin Wyatt reports, "The [facility] ED on Monday. My brother called them. I was drinking a lot & playing with a box cutter. I was just tossing the box cutter around. I was just randomly upset yesterday. There were no particular trigger. I don't have any drinking problems. I was only drinking 6 cans of beer & a shot of liquor twice a month. I was not drunk on this day. I'm not depressed & I was not suicidal. I was hospitalized in a psychiatric hospital about 1 year ago for being upset, not a big deal. I'm doing good. No withdrawal symptoms".  Kevin Wyatt [facility] with a BAL of 201 per toxicology test reports. However, his reason for admission was reported suicidal threats to cut himself with a box cutter. He was apparently drinking heavily or drunk at the time. He reported one hx of psychiatric hospitalization about 1 year ago for being  upset. He was however, not presenting with any alcohol withdrawal symptoms & he denied being depressed on admission. He did not receive any alcohol detoxification treatments. After evaluation of his symptoms, Kevin Wyatt was started on medication regimen for depression/agiation. His medication regimen included; Gabapentin 300 mg for agitation, Sertraline 25 mg for depression & trazodone 50 mg for depression. He was also enrolled in & participated in the group counseling sessions being offered and held on this unit, he learned coping skills that should help him cope better and maintain mood stability after discharge. He presented no other significant pre-existing  health issues that required treatment and or monitoring. Kevin Wyatt tolerated his treatment regimen without any significant adverse effects and or reactions reported.  During his hospital stay, Meshach's symptoms were evaluated on daily basis by a clinical provider to ascertain his symptoms are responding to his treatment regimen. This is evidenced by his reports of decreasing symptoms, improved mood, sleep, appetite & presentation of good affect. He is currently being discharged to continue psychiatric treatment and medication management as noted below. He is provided with all the pertinent information required to make this appointment without problems.   On this day of his hospital discharge, Keitj is in much improved condition than upon admission. He contracted for his safety and felt more in control of his mood. His symptoms were reported as significantly decreased or resolved completely.He denied SIHI & voiced no AVH. He is instructed & motivated to continue taking medications with a goal of continued improvement in mental health. He was picked up by his family. He left [facility] in no apparent distress with all belongings.  Physical Findings: AIMS: Facial and Oral Movements Muscles of Facial Expression: None, normal Lips and Perioral Area: None, normal Jaw: None, normal Tongue: None, normal,Extremity Movements Upper (arms, wrists, hands, fingers): None, normal Lower (legs, knees, ankles, toes): None, normal, Trunk Movements Neck, shoulders, hips: None, normal, Overall Severity Severity of abnormal movements (highest score from questions above): None, normal Incapacitation due to abnormal movements: None, normal Patient's awareness of abnormal movements (rate only patient's report): No Awareness, Dental Status Current problems with teeth and/or dentures?: No Does patient usually wear dentures?: No  CIWA:  CIWA-Ar Total: 0 COWS:     Musculoskeletal: Strength & Muscle Tone: within normal limits Gait  & Station: normal Patient leans: N/A  Psychiatric Specialty Exam: Physical Exam  Constitutional: He appears well-developed.  HENT:  Head: Normocephalic.  Eyes: Pupils are equal, round, and reactive to light.  Neck: Normal range of motion.  Cardiovascular: Normal rate.   Respiratory: Effort normal.  GI: Soft.  Genitourinary:  Genitourinary Comments: Denies any issues in this area  Musculoskeletal: Normal range of motion.  Neurological: He is alert.  Skin: Skin is warm.    Review of Systems  Constitutional: Negative.   HENT: Negative.   Eyes: Negative.   Respiratory: Negative.   Cardiovascular: Negative.   Gastrointestinal: Negative.   Musculoskeletal: Negative.   Skin: Negative.   Neurological: Negative.   Endo/Heme/Allergies: Negative.   Psychiatric/Behavioral: Positive for substance abuse (Alcoholism, chronic). Negative for depression, hallucinations and suicidal ideas. The patient has insomnia (Stable). The patient is not nervous/anxious.     Blood pressure 105/83, pulse 71, temperature 97.8 F (36.6 C), temperature source Oral, resp. rate 18, height 5\' 11"  (1.803 m), weight 107.5 kg (237 lb).Body mass index is 33.05 kg/m.  See Md's SRA   Have you used any form of tobacco in the last 30 days? (Cigarettes, Smokeless Tobacco, Cigars, and/or  Pipes): Yes  Has this patient used any form of tobacco in the last 30 days? (Cigarettes, Smokeless Tobacco, Cigars, and/or Pipes): Yes, provided with nicorette gum prescription.  Blood Alcohol level:  Lab Results  Component Value Date   ETH 201 (H) 05/30/2016   ETH 287 (H) 10/17/2015   Metabolic Disorder Labs:  No results found for: HGBA1C, MPG No results found for: PROLACTIN No results found for: CHOL, TRIG, HDL, CHOLHDL, VLDL, LDLCALC  See Psychiatric Specialty Exam and Suicide Risk Assessment completed by Attending Physician prior to discharge.  Discharge destination:  Home  Is patient on multiple antipsychotic therapies  at discharge:  No   Has Patient had three or more failed trials of antipsychotic monotherapy by history:  No  Recommended Plan for Multiple Antipsychotic Therapies: NA    Medication List    TAKE these medications     Indication  gabapentin 300 MG capsule Commonly known as:  NEURONTIN Take 1 capsule (300 mg total) by mouth at bedtime. For agitation  Indication:  Agitation   nicotine polacrilex 2 MG gum Commonly known as:  NICORETTE Take 1 each (2 mg total) by mouth as needed for smoking cessation.  Indication:  Nicotine Addiction   sertraline 25 MG tablet Commonly known as:  ZOLOFT Take 1 tablet (25 mg total) by mouth daily. For depression  Indication:  Major Depressive Disorder   traZODone 50 MG tablet Commonly known as:  DESYREL Take 1 tablet (50 mg total) by mouth at bedtime as needed for sleep.  Indication:  Trouble Sleeping      Follow-up Information    Social Worker will call you within 2-3 business days with therapy appt information. .         Follow-up recommendations:  Activity:  As tolerated Diet: As recommended by your primary care doctor. Keep all scheduled follow-up appointments as recommended.  Comments: Patient is instructed prior to discharge to: Take all medications as prescribed by his/her mental healthcare provider. Report any adverse effects and or reactions from the medicines to his/her outpatient provider promptly. Patient has been instructed & cautioned: To not engage in alcohol and or illegal drug use while on prescription medicines. In the event of worsening symptoms, patient is instructed to call the crisis hotline, 911 and or go to the nearest ED for appropriate evaluation and treatment of symptoms. To follow-up with his/her primary care provider for your other medical issues, concerns and or health care needs.   Signed: Sanjuana Kava, NP, PMHNP, FNP-BC 06/02/2016, 3:34 PM

## 2016-07-01 ENCOUNTER — Encounter (HOSPITAL_COMMUNITY): Payer: Self-pay | Admitting: *Deleted

## 2016-07-01 ENCOUNTER — Emergency Department (HOSPITAL_COMMUNITY)
Admission: EM | Admit: 2016-07-01 | Discharge: 2016-07-01 | Disposition: A | Discharge status: Home / Self Care | Payer: Commercial Managed Care - HMO | Attending: Emergency Medicine | Admitting: Emergency Medicine

## 2016-07-01 ENCOUNTER — Emergency Department (HOSPITAL_COMMUNITY): Payer: Commercial Managed Care - HMO

## 2016-07-01 DIAGNOSIS — R079 Chest pain, unspecified: Secondary | ICD-10-CM | POA: Insufficient documentation

## 2016-07-01 DIAGNOSIS — Z79899 Other long term (current) drug therapy: Secondary | ICD-10-CM | POA: Insufficient documentation

## 2016-07-01 DIAGNOSIS — F909 Attention-deficit hyperactivity disorder, unspecified type: Secondary | ICD-10-CM | POA: Diagnosis not present

## 2016-07-01 DIAGNOSIS — F17210 Nicotine dependence, cigarettes, uncomplicated: Secondary | ICD-10-CM | POA: Diagnosis not present

## 2016-07-01 LAB — BASIC METABOLIC PANEL
ANION GAP: 8 (ref 5–15)
BUN: 12 mg/dL (ref 6–20)
CHLORIDE: 105 mmol/L (ref 101–111)
CO2: 26 mmol/L (ref 22–32)
Calcium: 9.7 mg/dL (ref 8.9–10.3)
Creatinine, Ser: 1.18 mg/dL (ref 0.61–1.24)
GFR calc non Af Amer: >60 mL/min (ref >60)
GLUCOSE: 86 mg/dL (ref 65–99)
Potassium: 3.8 mmol/L (ref 3.5–5.1)
Sodium: 139 mmol/L (ref 135–145)

## 2016-07-01 LAB — CBC
HCT: 46.5 % (ref 39.0–52.0)
HEMOGLOBIN: 16 g/dL (ref 13.0–17.0)
MCH: 29.6 pg (ref 26.0–34.0)
MCHC: 34.4 g/dL (ref 30.0–36.0)
MCV: 86 fL (ref 78.0–100.0)
Platelets: 279 10*3/uL (ref 150–400)
RBC: 5.41 MIL/uL (ref 4.22–5.81)
RDW: 12.2 % (ref 11.5–15.5)
WBC: 6 10*3/uL (ref 4.0–10.5)

## 2016-07-01 LAB — TROPONIN I

## 2016-07-01 MED ORDER — GI COCKTAIL ~~LOC~~
30.0000 mL | Freq: Once | ORAL | Status: AC
  Administered 2016-07-01: 30 mL via ORAL
  Filled 2016-07-01: qty 30

## 2016-07-01 MED ORDER — OMEPRAZOLE 20 MG PO CPDR: 20.0000 mg | DELAYED_RELEASE_CAPSULE | Freq: Two times a day (BID) | ORAL | 0 refills | Status: DC

## 2016-07-01 NOTE — ED Provider Notes (Addendum)
[facility]PT Provider Note   CSN: [id number] Arrival date & time: 07/01/16  1811      History   Chief Complaint Chief Complaint  Patient presents with  . Chest Pain    HPI Kevin Wyatt is a 20 y.o. male.  HPI Patient ports anterior chest pain for several months.  Is worsened over the past several weeks.  It is worse with food and worse when he lays down at night.  He has a prior history of alcohol abuse but is no longer drinking.  He's been sober now for several weeks.  He is living at home with his mother.  He does have a history of GERD and is been on Zantac before in the past but is currently not taking any.  He denies nausea vomiting.  No diarrhea.  No back pain.  No arm pain.  No exertional chest pain or shortness of breath.   Past Medical History:  Diagnosis Date  . ADHD (attention deficit hyperactivity disorder)   . Anxiety   . Dizziness   . GERD (gastroesophageal reflux disease)    w/ prrosis & erucatation  . Nausea   . Obesity   . Vasovagal syncope     Patient Active Problem List   Diagnosis Date Noted  . 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 No pertinent surgical history.     Home Medications    Prior to Admission medications   Medication Sig Start Date End Date Taking? Authorizing Provider  Chlorpheniramine Maleate (ALLERGY RELIEF PO) Take 1 tablet by mouth daily.   Yes Historical Provider, MD  RaNITidine HCl (ACID REDUCER PO) Take 1 tablet by mouth daily.   Yes Historical Provider, MD  Simethicone (GAS RELIEF 80 PO) Take 1-2 tablets by mouth daily as needed.   Yes Historical Provider, MD  gabapentin (NEURONTIN) 300 MG capsule Take 1 capsule (300 mg total) by mouth at bedtime. For agitation Patient not taking: Reported on 07/01/2016 06/02/16   Sanjuana KavaAgnes I Nwoko, NP  nicotine polacrilex (NICORETTE) 2 MG gum Take 1 each (2 mg total) by mouth as needed for smoking cessation. Patient not taking: Reported on 07/01/2016  06/02/16   Sanjuana KavaAgnes I Nwoko, NP  omeprazole (PRILOSEC) 20 MG capsule Take 1 capsule (20 mg total) by mouth 2 (two) times daily before a meal. 07/01/16   Azalia BilisKevin Srikar Chiang, MD  sertraline (ZOLOFT) 25 MG tablet Take 1 tablet (25 mg total) by mouth daily. For depression Patient not taking: Reported on 07/01/2016 06/02/16   Sanjuana KavaAgnes I Nwoko, NP  traZODone (DESYREL) 50 MG tablet Take 1 tablet (50 mg total) by mouth at bedtime as needed for sleep. Patient not taking: Reported on 07/01/2016 06/02/16   Sanjuana KavaAgnes I Nwoko, NP    Family History Family History  Problem Relation Age of Onset  . Hypertension Mother   . Hypertension Father   . Alzheimer's disease Maternal Grandmother   . Stroke Maternal Grandmother   . Heart attack Maternal Grandmother   . Hypertension Maternal Grandfather   . Hypertension Paternal Grandmother   . Heart attack Paternal Grandmother   . Lung cancer Paternal Grandmother   . Cancer Paternal Grandfather     Social History Social History  Substance Use Topics  . Smoking status: Current Every Day Smoker    Packs/day: 1.00    Years: 7.00    Types: Cigarettes, E-cigarettes  . Smokeless tobacco: Never Used     Comment: uses vape   . Alcohol use Yes  Allergies   Review of patient's allergies indicates no known allergies.   Review of Systems Review of Systems  All other systems reviewed and are negative.    Physical Exam Updated Vital Signs BP 116/74   Pulse (!) 55   Temp 97.9 F (36.6 C) (Oral)   Resp 24   Ht 5\' 11"  (1.803 m)   Wt 243 lb (110.2 kg)   SpO2 96%   BMI 33.89 kg/m   Physical Exam  Constitutional: He is oriented to person, place, and time. He appears well-developed and well-nourished.  HENT:  Head: Normocephalic and atraumatic.  Eyes: EOM are normal.  Neck: Normal range of motion.  Cardiovascular: Normal rate, regular rhythm, normal heart sounds and intact distal pulses.   Pulmonary/Chest: Effort normal and breath sounds normal. No respiratory  distress.  Abdominal: Soft. He exhibits no distension. There is no tenderness.  Musculoskeletal: Normal range of motion.  Neurological: He is alert and oriented to person, place, and time.  Skin: Skin is warm and dry.  Psychiatric: He has a normal mood and affect. Judgment normal.  Nursing note and vitals reviewed.    ED Treatments / Results  Labs (all labs ordered are listed, but only abnormal results are displayed) Labs Reviewed  BASIC METABOLIC PANEL  CBC  TROPONIN I    EKG  EKG Interpretation  Date/Time:  Friday July 01 2016 18:17:28 EDT Ventricular Rate:  82 PR Interval:  148 QRS Duration: 96 QT Interval:  348 QTC Calculation: 406 R Axis:   54 Text Interpretation:  Normal sinus rhythm Normal ECG No significant change was found Confirmed by Nikol Lemar  MD, Caryn Bee ([id number]) on 07/01/2016 6:24:06 PM       Radiology Dg Chest 2 View  Result Date: 07/01/2016 CLINICAL DATA:  Chronic intermittent central chest pain and shortness of breath. Initial encounter. EXAM: CHEST  2 VIEW COMPARISON:  Chest radiograph performed 08/04/2015 FINDINGS: The lungs are well-aerated and clear. There is no evidence of focal opacification, pleural effusion or pneumothorax. The heart is normal in size; the mediastinal contour is within normal limits. No acute osseous abnormalities are seen. IMPRESSION: No acute cardiopulmonary process seen. Electronically Signed   By: Roanna Raider M.D.   On: 07/01/2016 20:12    Procedures Procedures (including critical care time)  Medications Ordered in ED Medications  gi cocktail (Maalox,Lidocaine,Donnatal) (30 mLs Oral Given 07/01/16 1954)     Initial Impression / Assessment and Plan / ED Course  I have reviewed the triage vital signs and the nursing notes.  Pertinent labs & imaging results that were available during my care of the patient were reviewed by me and considered in my medical decision making (see chart for details).  Clinical Course     Symptoms seem consistent with gastroesophageal reflux disease.  Patient referred outpatient GI for possible endoscopy.  Patient be started on twice a day Prilosec.  He understands to return to the ER for new or worsening symptoms.  Doubt PE.  Doubt ACS.  Final Clinical Impressions(s) / ED Diagnoses   Final diagnoses:  Chest pain, unspecified chest pain type    New Prescriptions Discharge Medication List as of 07/01/2016  9:17 PM    START taking these medications   Details  omeprazole (PRILOSEC) 20 MG capsule Take 1 capsule (20 mg total) by mouth 2 (two) times daily before a meal., Starting Fri 07/01/2016, Print         Azalia Bilis, MD 07/01/16 2246    Azalia Bilis, MD  08/18/16 0629  

## 2016-07-01 NOTE — ED Triage Notes (Signed)
Pt comes in with central chest pain that is intermittent for the last month. Denies pain at this time. NAD noted.

## 2016-07-07 ENCOUNTER — Telehealth: Payer: Self-pay | Admitting: Gastroenterology

## 2016-07-07 NOTE — Telephone Encounter (Signed)
Pt's mother, Aram BeechamCynthia, called to say that the ER had referred her son to us for a follow up and wanted him seen ASAP and in the morning time.  I explained to her that we would need for the provider to review the medical records first to see if we can accept him as a new patient. She asked for us to call her at [phone] Please advise if he can accept him as a new patient.

## 2016-07-08 NOTE — Telephone Encounter (Signed)
We can accept him but it does not need to be ASAP. May put him in a routine new patient appt.

## 2016-07-11 ENCOUNTER — Encounter: Payer: Self-pay | Admitting: Internal Medicine

## 2016-07-11 NOTE — Telephone Encounter (Signed)
OV made and letter mailed °

## 2016-08-12 ENCOUNTER — Encounter: Payer: Self-pay | Admitting: Gastroenterology

## 2016-08-12 ENCOUNTER — Ambulatory Visit (INDEPENDENT_AMBULATORY_CARE_PROVIDER_SITE_OTHER): Payer: Commercial Managed Care - HMO | Admitting: Gastroenterology

## 2016-08-12 DIAGNOSIS — K219 Gastro-esophageal reflux disease without esophagitis: Secondary | ICD-10-CM | POA: Diagnosis not present

## 2016-08-12 MED ORDER — SUCRALFATE 1 GM/10ML PO SUSP: 1.0000 g | Freq: Three times a day (TID) | ORAL | 0 refills | Status: DC

## 2016-08-12 NOTE — Progress Notes (Signed)
Primary Care Physician:  Colette Ribas, MD Primary Gastroenterologist:  Dr. Jena Gauss   Chief Complaint  Patient presents with  . Gastroesophageal Reflux    seen in ER, has pain in chest, going on few months    HPI:   Kevin Wyatt is a 20 y.o. male presenting today at the request of the ED secondary to GERD and chest discomfort.   Onset of chest pain a few months ago. Present constantly. If doesn't take Prilosec is worse. Taking Prilosec BID and only helps a small amount. Has developed a "tic" where he has now almost rubbed his chest raw. Denies any shortness of breath associated with pain. Eating precipitates symptoms. No dysphagia. No significant abdominal pain. No N/V. No odynophagia. Will sometimes have to walk around and burp/pass flatus and feel better. Mixes apple cider vinegar, water, baking soda to chug all at once. Gallbladder present. Good appetite, no unexplained weight loss. Rare NSAIDs. Very picky about pills.   CBC normal. CMP normal. EKG normal. Troponin normal.   History of ETOH abuse but none in 2 months. He is motivated to pursue a college degree and interested in flipping houses.   Past Medical History:  Diagnosis Date  . ADHD (attention deficit hyperactivity disorder)   . Anxiety   . Dizziness   . GERD (gastroesophageal reflux disease)    w/ prrosis & erucatation  . Nausea   . Obesity   . Vasovagal syncope     Past Surgical History:  Procedure Laterality Date  . None      Current Outpatient Prescriptions  Medication Sig Dispense Refill  . omeprazole (PRILOSEC) 20 MG capsule Take 1 capsule (20 mg total) by mouth 2 (two) times daily before a meal. 60 capsule 0   No current facility-administered medications for this visit.     Allergies as of 08/12/2016  . (No Known Allergies)    Family History  Problem Relation Age of Onset  . Hypertension Mother   . Hypertension Father   . Alzheimer's disease Maternal Grandmother   . Stroke Maternal  Grandmother   . Heart attack Maternal Grandmother   . Hypertension Maternal Grandfather   . Hypertension Paternal Grandmother   . Heart attack Paternal Grandmother   . Lung cancer Paternal Grandmother   . Cancer Paternal Grandfather   . Colon cancer Neg Hx     Social History   Social History  . Marital status: Single    Spouse name: N/A  . Number of children: N/A  . Years of education: N/A   Occupational History  . Not on file.   Social History Main Topics  . Smoking status: Current Every Day Smoker    Packs/day: 0.33    Years: 7.00    Types: Cigarettes, E-cigarettes  . Smokeless tobacco: Never Used     Comment: uses vape   . Alcohol use No     Comment: history of ETOH abuse, 2 months clean now.   . Drug use: No  . Sexual activity: Yes    Birth control/ protection: Condom   Other Topics Concern  . Not on file   Social History Narrative  . No narrative on file    Review of Systems: Gen: Denies any fever, chills, fatigue, weight loss, lack of appetite.  CV: see HPI  Resp: see HPI  GI: see HPI  GU : Denies urinary burning, urinary frequency, urinary hesitancy MS: Denies joint pain, muscle weakness, cramps, or limitation of movement.  Derm: Denies rash, itching, dry skin Psych: Denies depression, anxiety, memory loss, and confusion Heme: Denies bruising, bleeding, and enlarged lymph nodes.  Physical Exam: BP (!) 146/75   Pulse 81   Temp 98.2 F (36.8 C) (Oral)   Ht 5\' 11"  (1.803 m)   Wt 239 lb 6.4 oz (108.6 kg)   BMI 33.39 kg/m  General:   Alert and oriented. Pleasant and cooperative. Well-nourished and well-developed.  Head:  Normocephalic and atraumatic. Eyes:  Without icterus, sclera clear and conjunctiva pink.  Ears:  Normal auditory acuity. Nose:  No deformity, discharge,  or lesions. Mouth:  No deformity or lesions, oral mucosa pink.  Lungs:  Clear to auscultation bilaterally. No wheezes, rales, or rhonchi. No distress.  Heart:  S1, S2 present  without murmurs appreciated.  Abdomen:  +BS, soft, mild TTP epigastric and non-distended. No HSM noted. No guarding or rebound. No masses appreciated.  Rectal:  Deferred  Msk:  Symmetrical without gross deformities. Normal posture. Extremities:  Without  edema. Neurologic:  Alert and  oriented x4 Psych:  Alert and cooperative. Normal mood and affect.  Lab Results  Component Value Date   WBC 6.0 07/01/2016   HGB 16.0 07/01/2016   HCT 46.5 07/01/2016   MCV 86.0 07/01/2016   PLT 279 07/01/2016   Lab Results  Component Value Date   ALT 55 05/30/2016   AST 26 05/30/2016   ALKPHOS 78 05/30/2016   BILITOT 0.4 05/30/2016   Lab Results  Component Value Date   CREATININE 1.18 07/01/2016   BUN 12 07/01/2016   NA 139 07/01/2016   K 3.8 07/01/2016   CL 105 07/01/2016   CO2 26 07/01/2016  ;

## 2016-08-12 NOTE — Patient Instructions (Signed)
Continue taking Prilosec twice a day.   I have sent Carafate in to the pharmacy to take four times a day.   We have scheduled you for an upper endoscopy with Dr. Jena Gaussourk in the near future.   We will see you in 6 weeks.

## 2016-08-15 ENCOUNTER — Telehealth: Payer: Self-pay

## 2016-08-15 DIAGNOSIS — K219 Gastro-esophageal reflux disease without esophagitis: Secondary | ICD-10-CM | POA: Insufficient documentation

## 2016-08-15 NOTE — Telephone Encounter (Signed)
Tried to call pt to set-up EGD w/Propofol. No answer at home phone number and unable to leave a message. Mobile number is no longer in service.

## 2016-08-15 NOTE — Assessment & Plan Note (Signed)
20 year old male presenting with GERD and chest discomfort refractory to BID PPI, worsened by eating, associated belching. No dysphagia, odynophagia, or abdominal pain. Rare NSAIDs. History of ETOH abuse but has abstained for at least 2 months. Labs thus far unrevealing, and cardiac evaluation with troponins and EKG normal. Doubt cardiac etiology at this point. Due to significance of symptoms will proceed with EGD. If EGD unrevealing, discussed pursuing US abdomen as gallbladder remains in situ but biliary etiology less likely.   Proceed with upper endoscopy in the near future with Dr. Jena Gaussourk. The risks, benefits, and alternatives have been discussed in detail with patient. They have stated understanding and desire to proceed.  PROPOFOL due to history of ETOH abuse Continue Prilosec BID for now, add Carafate Return in 6 weeks for further evaluation

## 2016-08-15 NOTE — Progress Notes (Signed)
CC'D TO PCP °

## 2016-08-16 NOTE — Telephone Encounter (Signed)
Tried to call pt to set-up EGD. LMOM for pt to call office.

## 2016-08-18 NOTE — Telephone Encounter (Signed)
Pt has not called office to set-up EGD w/Propofol with RMR. Letter mailed to pt.

## 2016-09-02 ENCOUNTER — Telehealth: Payer: Self-pay

## 2016-09-02 NOTE — Telephone Encounter (Signed)
Pt's mother called to set-up EGD w/Propofol with RMR. Pt's office visit was 08/12/16. Advised pt's mother he would have to come in for another office visit to schedule procedure d/t procedure can't be scheduled within 30 days of previous office visit. Pt already scheduled to see AB 09/21/16 for f/u. Pt's mother wanted to keep that appt. (see previous telephone notes).

## 2016-09-02 NOTE — Telephone Encounter (Signed)
Noted. It is apparent we have tried to accommodate and get him in for a visit; agree he needs a visit to set this up.

## 2016-09-21 ENCOUNTER — Ambulatory Visit: Payer: Self-pay | Admitting: Gastroenterology

## 2016-09-21 ENCOUNTER — Ambulatory Visit (INDEPENDENT_AMBULATORY_CARE_PROVIDER_SITE_OTHER): Payer: Commercial Managed Care - HMO | Admitting: Gastroenterology

## 2016-09-21 ENCOUNTER — Encounter: Payer: Self-pay | Admitting: Gastroenterology

## 2016-09-21 VITALS — BP 130/89 | HR 75 | Temp 98.0°F | Ht 71.0 in | Wt 240.4 lb

## 2016-09-21 DIAGNOSIS — K219 Gastro-esophageal reflux disease without esophagitis: Secondary | ICD-10-CM

## 2016-09-21 MED ORDER — OMEPRAZOLE-SODIUM BICARBONATE 20-1100 MG PO CAPS: 1.0000 | ORAL_CAPSULE | Freq: Two times a day (BID) | ORAL | 3 refills | Status: DC

## 2016-09-21 MED ORDER — PANTOPRAZOLE SODIUM 40 MG PO TBEC: 40.0000 mg | DELAYED_RELEASE_TABLET | Freq: Two times a day (BID) | ORAL | 3 refills | Status: DC

## 2016-09-21 NOTE — Assessment & Plan Note (Signed)
20 year old male doing well with Zegerid 20 mg BID. Chest discomfort and reflux much improved. Notes it is exacerbated after alcohol. Discussed limiting alcohol and avoiding precipitating factors if possible. No need for EGD right now. Return in 6 months.

## 2016-09-21 NOTE — Progress Notes (Signed)
Referring Provider: Assunta FoundGolding, John, MD Primary Care Physician:  Colette RibasGOLDING, JOHN CABOT, MD  Primary GI: Dr. Jena Gaussourk   Chief Complaint  Patient presents with  . Gastroesophageal Reflux    overall better, has some after consuming alcohol    HPI:   Kevin Wyatt is a 20 y.o. male presenting today with a history of GERD and chest discomfort. CBC, CMP, EKG, troponin normal. Originally seen late Oct 2017. EGD considered due to refractory GERD. We were unable to set this up for him as unable to reach.   No dysphagia. No abdominal pain. Taking Zegerid BID.     Past Medical History:  Diagnosis Date  . ADHD (attention deficit hyperactivity disorder)   . Anxiety   . Dizziness   . GERD (gastroesophageal reflux disease)    w/ prrosis & erucatation  . Nausea   . Obesity   . Vasovagal syncope     Past Surgical History:  Procedure Laterality Date  . None      Current Outpatient Prescriptions  Medication Sig Dispense Refill  . Omeprazole-Sodium Bicarbonate (ZEGERID) 20-1100 MG CAPS capsule Take 1 capsule by mouth 2 (two) times daily.    Marland Kitchen. omeprazole (PRILOSEC) 20 MG capsule Take 1 capsule (20 mg total) by mouth 2 (two) times daily before a meal. (Patient not taking: Reported on 09/21/2016) 60 capsule 0  . sucralfate (CARAFATE) 1 GM/10ML suspension Take 10 mLs (1 g total) by mouth 4 (four) times daily -  with meals and at bedtime. (Patient not taking: Reported on 09/21/2016) 420 mL 0   No current facility-administered medications for this visit.     Allergies as of 09/21/2016  . (No Known Allergies)    Family History  Problem Relation Age of Onset  . Hypertension Mother   . Hypertension Father   . Alzheimer's disease Maternal Grandmother   . Stroke Maternal Grandmother   . Heart attack Maternal Grandmother   . Hypertension Maternal Grandfather   . Hypertension Paternal Grandmother   . Heart attack Paternal Grandmother   . Lung cancer Paternal Grandmother   . Cancer Paternal  Grandfather   . Colon cancer Neg Hx     Social History   Social History  . Marital status: Single    Spouse name: N/A  . Number of children: N/A  . Years of education: N/A   Social History Main Topics  . Smoking status: Current Every Day Smoker    Packs/day: 0.33    Years: 7.00    Types: Cigarettes, E-cigarettes  . Smokeless tobacco: Never Used     Comment: uses vape   . Alcohol use No     Comment: history of ETOH abuse, 2 months clean now.   . Drug use: No  . Sexual activity: Yes    Birth control/ protection: Condom   Other Topics Concern  . None   Social History Narrative  . None    Review of Systems: As mentioned in HPI   Physical Exam: BP 130/89   Pulse 75   Temp 98 F (36.7 C) (Oral)   Ht 5\' 11"  (1.803 m)   Wt 240 lb 6.4 oz (109 kg)   BMI 33.53 kg/m  General:   Alert and oriented. No distress noted. Pleasant and cooperative.  Head:  Normocephalic and atraumatic. Eyes:  Conjuctiva clear without scleral icterus. Abdomen:  +BS, soft, non-tender and non-distended. No rebound or guarding. No HSM or masses noted. Msk:  Symmetrical without gross deformities. Normal posture. Extremities:  Without edema. Neurologic:  Alert and  oriented x4;  grossly normal neurologically. Psych:  Alert and cooperative. Normal mood and affect.  Lab Results  Component Value Date   WBC 6.0 07/01/2016   HGB 16.0 07/01/2016   HCT 46.5 07/01/2016   MCV 86.0 07/01/2016   PLT 279 07/01/2016   Lab Results  Component Value Date   ALT 55 05/30/2016   AST 26 05/30/2016   ALKPHOS 78 05/30/2016   BILITOT 0.4 05/30/2016   Lab Results  Component Value Date   CREATININE 1.18 07/01/2016   BUN 12 07/01/2016   NA 139 07/01/2016   K 3.8 07/01/2016   CL 105 07/01/2016   CO2 26 07/01/2016

## 2016-09-21 NOTE — Patient Instructions (Addendum)
I have sent a prescription for Zegerid 20/1100 mg capsules to take twice a day, 30 minutes before breakfast and dinner.   We will see you in 6 months.   Call if any changes. We will hold off on any upper endoscopy right now.   Food Choices for Gastroesophageal Reflux Disease, Adult When you have gastroesophageal reflux disease (GERD), the foods you eat and your eating habits are very important. Choosing the right foods can help ease your discomfort. What guidelines do I need to follow?  Choose fruits, vegetables, whole grains, and low-fat dairy products.  Choose low-fat meat, fish, and poultry.  Limit fats such as oils, salad dressings, butter, nuts, and avocado.  Keep a food diary. This helps you identify foods that cause symptoms.  Avoid foods that cause symptoms. These may be different for everyone.  Eat small meals often instead of 3 large meals a day.  Eat your meals slowly, in a place where you are relaxed.  Limit fried foods.  Cook foods using methods other than frying.  Avoid drinking alcohol.  Avoid drinking large amounts of liquids with your meals.  Avoid bending over or lying down until 2-3 hours after eating. What foods are not recommended? These are some foods and drinks that may make your symptoms worse: Vegetables  Tomatoes. Tomato juice. Tomato and spaghetti sauce. Chili peppers. Onion and garlic. Horseradish. Fruits  Oranges, grapefruit, and lemon (fruit and juice). Meats  High-fat meats, fish, and poultry. This includes hot dogs, ribs, ham, sausage, salami, and bacon. Dairy  Whole milk and chocolate milk. Sour cream. Cream. Butter. Ice cream. Cream cheese. Drinks  Coffee and tea. Bubbly (carbonated) drinks or energy drinks. Condiments  Hot sauce. Barbecue sauce. Sweets/Desserts  Chocolate and cocoa. Donuts. Peppermint and spearmint. Fats and Oils  High-fat foods. This includes [place] fries and potato chips. Other  Vinegar. Strong spices. This  includes black pepper, white pepper, red pepper, cayenne, curry powder, cloves, ginger, and chili powder. The items listed above may not be a complete list of foods and drinks to avoid. Contact your dietitian for more information.  This information is not intended to replace advice given to you by your health care provider. Make sure you discuss any questions you have with your health care provider. Document Released: 04/03/2012 Document Revised: 03/10/2016 Document Reviewed: 08/07/2013 Elsevier Interactive Patient Education  2017 [employer]

## 2016-09-21 NOTE — Progress Notes (Signed)
cc'ed to pcp °

## 2017-03-22 ENCOUNTER — Encounter: Payer: Self-pay | Admitting: Gastroenterology

## 2017-03-22 ENCOUNTER — Telehealth: Payer: Self-pay | Admitting: Gastroenterology

## 2017-03-22 ENCOUNTER — Ambulatory Visit: Payer: Commercial Managed Care - HMO | Admitting: Gastroenterology

## 2017-03-22 DIAGNOSIS — Z719 Counseling, unspecified: Secondary | ICD-10-CM | POA: Diagnosis not present

## 2017-03-22 DIAGNOSIS — R1110 Vomiting, unspecified: Secondary | ICD-10-CM | POA: Diagnosis not present

## 2017-03-22 DIAGNOSIS — B349 Viral infection, unspecified: Secondary | ICD-10-CM | POA: Diagnosis not present

## 2017-03-22 DIAGNOSIS — R509 Fever, unspecified: Secondary | ICD-10-CM | POA: Diagnosis not present

## 2017-03-22 DIAGNOSIS — Z1389 Encounter for screening for other disorder: Secondary | ICD-10-CM | POA: Diagnosis not present

## 2017-03-22 NOTE — Telephone Encounter (Signed)
Pt was a no show and letter sent  °

## 2017-08-20 DIAGNOSIS — M79674 Pain in right toe(s): Secondary | ICD-10-CM | POA: Diagnosis not present

## 2018-06-08 DIAGNOSIS — H6992 Unspecified Eustachian tube disorder, left ear: Secondary | ICD-10-CM | POA: Diagnosis not present

## 2018-06-08 DIAGNOSIS — H6121 Impacted cerumen, right ear: Secondary | ICD-10-CM | POA: Diagnosis not present

## 2018-06-08 DIAGNOSIS — H6592 Unspecified nonsuppurative otitis media, left ear: Secondary | ICD-10-CM | POA: Diagnosis not present

## 2018-08-23 DIAGNOSIS — Z23 Encounter for immunization: Secondary | ICD-10-CM | POA: Diagnosis not present

## 2018-10-02 DIAGNOSIS — H6123 Impacted cerumen, bilateral: Secondary | ICD-10-CM | POA: Diagnosis not present

## 2018-12-23 ENCOUNTER — Emergency Department (HOSPITAL_COMMUNITY)
Admission: EM | Admit: 2018-12-23 | Discharge: 2018-12-23 | Disposition: A | Discharge status: Home / Self Care | Payer: 59 | Attending: Emergency Medicine | Admitting: Emergency Medicine

## 2018-12-23 ENCOUNTER — Other Ambulatory Visit: Payer: Self-pay

## 2018-12-23 ENCOUNTER — Emergency Department (HOSPITAL_COMMUNITY): Payer: 59

## 2018-12-23 ENCOUNTER — Encounter (HOSPITAL_COMMUNITY): Payer: Self-pay | Admitting: *Deleted

## 2018-12-23 DIAGNOSIS — Y998 Other external cause status: Secondary | ICD-10-CM | POA: Insufficient documentation

## 2018-12-23 DIAGNOSIS — S0990XA Unspecified injury of head, initial encounter: Secondary | ICD-10-CM | POA: Insufficient documentation

## 2018-12-23 DIAGNOSIS — Y939 Activity, unspecified: Secondary | ICD-10-CM | POA: Insufficient documentation

## 2018-12-23 DIAGNOSIS — F17210 Nicotine dependence, cigarettes, uncomplicated: Secondary | ICD-10-CM | POA: Diagnosis not present

## 2018-12-23 DIAGNOSIS — Z79899 Other long term (current) drug therapy: Secondary | ICD-10-CM | POA: Diagnosis not present

## 2018-12-23 DIAGNOSIS — R4020 Unspecified coma: Secondary | ICD-10-CM | POA: Diagnosis not present

## 2018-12-23 DIAGNOSIS — F10220 Alcohol dependence with intoxication, uncomplicated: Secondary | ICD-10-CM | POA: Insufficient documentation

## 2018-12-23 DIAGNOSIS — S065X0A Traumatic subdural hemorrhage without loss of consciousness, initial encounter: Secondary | ICD-10-CM | POA: Diagnosis not present

## 2018-12-23 DIAGNOSIS — F10920 Alcohol use, unspecified with intoxication, uncomplicated: Secondary | ICD-10-CM

## 2018-12-23 DIAGNOSIS — R000 Tachycardia, unspecified: Secondary | ICD-10-CM | POA: Diagnosis not present

## 2018-12-23 DIAGNOSIS — S065X9A Traumatic subdural hemorrhage with loss of consciousness of unspecified duration, initial encounter: Secondary | ICD-10-CM | POA: Diagnosis not present

## 2018-12-23 DIAGNOSIS — Y9289 Other specified places as the place of occurrence of the external cause: Secondary | ICD-10-CM | POA: Insufficient documentation

## 2018-12-23 DIAGNOSIS — W0110XA Fall on same level from slipping, tripping and stumbling with subsequent striking against unspecified object, initial encounter: Secondary | ICD-10-CM | POA: Diagnosis not present

## 2018-12-23 DIAGNOSIS — R0902 Hypoxemia: Secondary | ICD-10-CM | POA: Diagnosis not present

## 2018-12-23 DIAGNOSIS — S199XXA Unspecified injury of neck, initial encounter: Secondary | ICD-10-CM | POA: Diagnosis not present

## 2018-12-23 LAB — COMPREHENSIVE METABOLIC PANEL
ALT: 49 U/L — ABNORMAL HIGH (ref 0–44)
ANION GAP: 13 (ref 5–15)
AST: 36 U/L (ref 15–41)
Albumin: 4 g/dL (ref 3.5–5.0)
Alkaline Phosphatase: 69 U/L (ref 38–126)
BUN: 8 mg/dL (ref 6–20)
CHLORIDE: 109 mmol/L (ref 98–111)
CO2: 18 mmol/L — ABNORMAL LOW (ref 22–32)
Calcium: 8.6 mg/dL — ABNORMAL LOW (ref 8.9–10.3)
Creatinine, Ser: 1.07 mg/dL (ref 0.61–1.24)
Glucose, Bld: 95 mg/dL (ref 70–99)
POTASSIUM: 3.1 mmol/L — AB (ref 3.5–5.1)
Sodium: 140 mmol/L (ref 135–145)
Total Bilirubin: 0.7 mg/dL (ref 0.3–1.2)
Total Protein: 6.9 g/dL (ref 6.5–8.1)

## 2018-12-23 LAB — CBC WITH DIFFERENTIAL/PLATELET
Abs Immature Granulocytes: 0.07 10*3/uL (ref 0.00–0.07)
BASOS PCT: 1 %
Basophils Absolute: 0.1 10*3/uL (ref 0.0–0.1)
EOS ABS: 0.1 10*3/uL (ref 0.0–0.5)
Eosinophils Relative: 1 %
HCT: 45.8 % (ref 39.0–52.0)
Hemoglobin: 14.9 g/dL (ref 13.0–17.0)
Immature Granulocytes: 1 %
Lymphocytes Relative: 34 %
Lymphs Abs: 2.8 10*3/uL (ref 0.7–4.0)
MCH: 28.3 pg (ref 26.0–34.0)
MCHC: 32.5 g/dL (ref 30.0–36.0)
MCV: 87.1 fL (ref 80.0–100.0)
MONO ABS: 0.7 10*3/uL (ref 0.1–1.0)
MONOS PCT: 9 %
Neutro Abs: 4.6 10*3/uL (ref 1.7–7.7)
Neutrophils Relative %: 54 %
PLATELETS: 305 10*3/uL (ref 150–400)
RBC: 5.26 MIL/uL (ref 4.22–5.81)
RDW: 12 % (ref 11.5–15.5)
WBC: 8.3 10*3/uL (ref 4.0–10.5)
nRBC: 0 % (ref 0.0–0.2)

## 2018-12-23 LAB — ETHANOL: ALCOHOL ETHYL (B): 280 mg/dL — AB (ref <10)

## 2018-12-23 LAB — RAPID URINE DRUG SCREEN, HOSP PERFORMED
AMPHETAMINES: NOT DETECTED
Barbiturates: NOT DETECTED
Benzodiazepines: NOT DETECTED
Cocaine: NOT DETECTED
OPIATES: NOT DETECTED
Tetrahydrocannabinol: NOT DETECTED

## 2018-12-23 MED ORDER — LORAZEPAM 2 MG/ML IJ SOLN
1.0000 mg | Freq: Once | INTRAMUSCULAR | Status: AC
  Administered 2018-12-23: 1 mg via INTRAVENOUS
  Filled 2018-12-23: qty 1

## 2018-12-23 MED ORDER — HALOPERIDOL LACTATE 5 MG/ML IJ SOLN
2.0000 mg | Freq: Once | INTRAMUSCULAR | Status: AC
  Administered 2018-12-23: 2 mg via INTRAVENOUS
  Filled 2018-12-23: qty 1

## 2018-12-23 MED ORDER — SODIUM CHLORIDE 0.9 % IV BOLUS (SEPSIS): 1000.0000 mL | Freq: Once | INTRAVENOUS | Status: DC

## 2018-12-23 NOTE — ED Notes (Signed)
Pt in hallway for observation until he wakes up  He pulled his iv out while in c-t  His films were not finished.  He opened his eyes momentarily   Both pupils 3.0 and react to light

## 2018-12-23 NOTE — ED Notes (Signed)
Pt is not A&O x4. Pt remembers what happened last night and is currently ready to leave
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
## 2018-12-23 NOTE — Discharge Instructions (Addendum)
As discussed, your evaluation today has been largely reassuring.  But, it is important that you monitor your condition carefully, and do not hesitate to return to the ED if you develop new, or concerning changes in your condition. ? ?Otherwise, please follow-up with your physician for appropriate ongoing care. ? ?

## 2018-12-23 NOTE — ED Notes (Signed)
Patient verbalizes understanding of discharge instructions. Opportunity for questioning and answers were provided. Armband removed by staff, pt discharged from ED ambulatory to home.  

## 2018-12-23 NOTE — ED Notes (Signed)
Annie Consolo- Mother- [phone]
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
## 2018-12-23 NOTE — ED Notes (Signed)
Pt sleeping on arrival   Sedated by ems

## 2018-12-23 NOTE — ED Provider Notes (Signed)
12:25 PM Patient awake, alert, ambulatory, in no distress, speaking clearly. He is disheveled, but otherwise generally well-appearing. Repeat CT scan did not demonstrate notable findings, and if there was small subdural on the initial study, this likely would have progressed and with the patient would have had a neurologic deficiencies.   Gerhard Munch, MD 12/23/18 1227

## 2019-02-22 DIAGNOSIS — Z6835 Body mass index (BMI) 35.0-35.9, adult: Secondary | ICD-10-CM | POA: Diagnosis not present

## 2019-02-22 DIAGNOSIS — E6609 Other obesity due to excess calories: Secondary | ICD-10-CM | POA: Diagnosis not present

## 2019-02-22 DIAGNOSIS — Z23 Encounter for immunization: Secondary | ICD-10-CM | POA: Diagnosis not present
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
## 2019-12-02 ENCOUNTER — Other Ambulatory Visit: Payer: Self-pay

## 2019-12-02 ENCOUNTER — Ambulatory Visit
Admission: EM | Admit: 2019-12-02 | Discharge: 2019-12-02 | Disposition: A | Discharge status: Home / Self Care | Payer: 59 | Attending: Emergency Medicine | Admitting: Emergency Medicine

## 2019-12-02 DIAGNOSIS — J069 Acute upper respiratory infection, unspecified: Secondary | ICD-10-CM

## 2019-12-02 DIAGNOSIS — Z20822 Contact with and (suspected) exposure to covid-19: Secondary | ICD-10-CM

## 2019-12-02 NOTE — ED Provider Notes (Addendum)
[facility]   [id number] 12/02/19 Arrival Time: 1618   CC: COVID test  SUBJECTIVE: History from: patient.  Kevin Wyatt is a 24 y.o. male who presents with headache, congestion, runny nose, sore throat, productive cough, chills, and a few episodes of diarrhea, x 3 days.  Denies sick exposure to COVID, flu or strep.  Denies recent travel.  Was seen by PCP this morning, prescribed antibiotic and steroid.  Here today just for COVID test.  Denies aggravating factors.  Denies previous COVID infection.  Denies fever, SOB, wheezing, chest pain, nausea, vomiting, changes in bowel or bladder habits.    Patient experienced anxiety while waiting in room to be seen.  LWBS, then returned to be evaluated and have COVID test.    ROS: As per HPI.  All other pertinent ROS negative.     Past Medical History:  Diagnosis Date  . ADHD (attention deficit hyperactivity disorder)   . Anxiety   . Dizziness   . GERD (gastroesophageal reflux disease)    w/ prrosis & erucatation  . Nausea   . Obesity   . Vasovagal syncope    Past Surgical History:  Procedure Laterality Date  . None     No Known Allergies No current facility-administered medications on file prior to encounter.   Current Outpatient Medications on File Prior to Encounter  Medication Sig Dispense Refill  . Omeprazole-Sodium Bicarbonate (ZEGERID) 20-1100 MG CAPS capsule Take 1 capsule by mouth 2 (two) times daily before a meal. 60 each 3   Social History   Socioeconomic History  . Marital status: Single    Spouse name: Not on file  . Number of children: Not on file  . Years of education: Not on file  . Highest education level: Not on file  Occupational History  . Not on file  Tobacco Use  . Smoking status: Former Smoker    Packs/day: 0.33    Years: 7.00    Pack years: 2.31    Types: Cigarettes, E-cigarettes  . Smokeless tobacco: Never Used  . Tobacco comment: uses vape   Substance and Sexual Activity  . Alcohol  use: No    Comment: history of ETOH abuse, 2 months clean now.   . Drug use: No  . Sexual activity: Yes    Birth control/protection: Condom  Other Topics Concern  . Not on file  Social History Narrative  . Not on file   Social Determinants of Health   Financial Resource Strain:   . Difficulty of Paying Living Expenses: Not on file  Food Insecurity:   . Worried About Programme researcher, broadcasting/film/video in the Last Year: Not on file  . Ran Out of Food in the Last Year: Not on file  Transportation Needs:   . Lack of Transportation (Medical): Not on file  . Lack of Transportation (Non-Medical): Not on file  Physical Activity:   . Days of Exercise per Week: Not on file  . Minutes of Exercise per Session: Not on file  Stress:   . Feeling of Stress : Not on file  Social Connections:   . Frequency of Communication with Friends and Family: Not on file  . Frequency of Social Gatherings with Friends and Family: Not on file  . Attends Religious Services: Not on file  . Active Member of Clubs or Organizations: Not on file  . Attends Banker Meetings: Not on file  . Marital Status: Not on file  Intimate Partner Violence:   .  Fear of Current or Ex-Partner: Not on file  . Emotionally Abused: Not on file  . Physically Abused: Not on file  . Sexually Abused: Not on file   Family History  Problem Relation Age of Onset  . Hypertension Mother   . Hypertension Father   . Alzheimer's disease Maternal Grandmother   . Stroke Maternal Grandmother   . Heart attack Maternal Grandmother   . Hypertension Maternal Grandfather   . Hypertension Paternal Grandmother   . Heart attack Paternal Grandmother   . Lung cancer Paternal Grandmother   . Cancer Paternal Grandfather   . Colon cancer Neg Hx     OBJECTIVE:  Vitals:   12/02/19 1633  BP: 128/81  Pulse: 79  Resp: 16  Temp: 98 F (36.7 C)  TempSrc: Oral  SpO2: 96%     General appearance: alert; appears mildly fatigued, but nontoxic;  speaking in full sentences and tolerating own secretions HEENT: NCAT; Ears: EACs clear, TMs pearly gray; Eyes: PERRL.  EOM grossly intact. Nose: nares patent without rhinorrhea, Throat: oropharynx clear, tonsils non erythematous or enlarged, uvula midline  Neck: supple without LAD Lungs: unlabored respirations, symmetrical air entry; cough: absent; no respiratory distress; CTAB Heart: regular rate and rhythm.   Skin: warm and dry Psychological: alert and cooperative; anxious mood and affect; left room prior to being seen, then returned to have PE and COVID test completed   ASSESSMENT & PLAN:  1. Suspected COVID-19 virus infection   2. Viral URI with cough    COVID testing ordered.  It will take between 2-5 days for test results.  Someone will contact you regarding abnormal results.    In the meantime: You should remain isolated in your home for 10 days from symptom onset AND greater than 72 hours after symptoms resolution (absence of fever without the use of fever-reducing medication and improvement in respiratory symptoms), whichever is longer Get plenty of rest and push fluids Use medications as prescribed by PCP Use OTC medications like ibuprofen or tylenol as needed fever or pain Call or go to the ED if you have any new or worsening symptoms such as fever, worsening cough, shortness of breath, chest tightness, chest pain, turning blue, changes in mental status, etc...   Reviewed expectations re: course of current medical issues. Questions answered. Outlined signs and symptoms indicating need for more acute intervention. Patient verbalized understanding. After Visit Summary given.     Lestine Box, PA-C 12/02/19 1744

## 2019-12-02 NOTE — ED Triage Notes (Addendum)
Pt presents to UC w/ c/o headache, congestion, runny nose, sore throat, cough, chills x3 days. Pt was prescribed abx steroid today, so he just wants the covid test done here.

## 2019-12-02 NOTE — Discharge Instructions (Signed)
COVID testing ordered.  It will take between 2-5 days for test results.  Someone will contact you regarding abnormal results.    In the meantime: You should remain isolated in your home for 10 days from symptom onset AND greater than 72 hours after symptoms resolution (absence of fever without the use of fever-reducing medication and improvement in respiratory symptoms), whichever is longer Get plenty of rest and push fluids Use medications as prescribed by PCP Use OTC medications like ibuprofen or tylenol as needed fever or pain Call or go to the ED if you have any new or worsening symptoms such as fever, worsening cough, shortness of breath, chest tightness, chest pain, turning blue, changes in mental status, etc..Marland Kitchen

## 2019-12-03 LAB — NOVEL CORONAVIRUS, NAA: SARS-CoV-2, NAA: NOT DETECTED

## 2020-06-01 ENCOUNTER — Ambulatory Visit
Admission: EM | Admit: 2020-06-01 | Discharge: 2020-06-01 | Disposition: A | Discharge status: Home / Self Care | Payer: Self-pay | Attending: Emergency Medicine | Admitting: Emergency Medicine

## 2020-06-01 ENCOUNTER — Other Ambulatory Visit: Payer: Self-pay

## 2020-06-01 ENCOUNTER — Emergency Department (HOSPITAL_COMMUNITY): Admission: EM | Admit: 2020-06-01 | Discharge: 2020-06-01 | Discharge status: Against Medical Advice | Payer: 59

## 2020-06-01 DIAGNOSIS — L02214 Cutaneous abscess of groin: Secondary | ICD-10-CM

## 2020-06-01 MED ORDER — DOXYCYCLINE HYCLATE 100 MG PO CAPS: 100.0000 mg | ORAL_CAPSULE | Freq: Two times a day (BID) | ORAL | 0 refills | Status: DC

## 2020-06-01 NOTE — ED Triage Notes (Signed)
Pt presents with c/o abscess on left groin area that developed a few days ago

## 2020-06-01 NOTE — Discharge Instructions (Signed)
Apply warm compresses 3-4x daily for 10-15 minutes Wash site daily with warm water and mild soap Keep covered to avoid friction Take antibiotic as prescribed and to completion Follow up here or with PCP if symptoms persists Return or go to the ED if you have any new or worsening symptoms increased redness, swelling, pain, nausea, vomiting, fever, chills, etc...  

## 2020-06-01 NOTE — ED Provider Notes (Signed)
[facility]   [id number] 06/01/20 Arrival Time: 1817   XA:JOINOMV  SUBJECTIVE:  Kevin Wyatt is a 24 y.o. male who presents with a possible abscess of his LT groin x 4 days. Denies precipitating event or trauma.  Reports increased redness, and swelling.  Has tried OTC medications and expressing at home without relief.  Reports previous symptom in the past. Denies fever, chills, nausea, vomiting   ROS: As per HPI.  All other pertinent ROS negative.     Past Medical History:  Diagnosis Date  . ADHD (attention deficit hyperactivity disorder)   . Anxiety   . Dizziness   . GERD (gastroesophageal reflux disease)    w/ prrosis & erucatation  . Nausea   . Obesity   . Vasovagal syncope    Past Surgical History:  Procedure Laterality Date  . None     No Known Allergies No current facility-administered medications on file prior to encounter.   Current Outpatient Medications on File Prior to Encounter  Medication Sig Dispense Refill  . Omeprazole-Sodium Bicarbonate (ZEGERID) 20-1100 MG CAPS capsule Take 1 capsule by mouth 2 (two) times daily before a meal. 60 each 3   Social History   Socioeconomic History  . Marital status: Single    Spouse name: Not on file  . Number of children: Not on file  . Years of education: Not on file  . Highest education level: Not on file  Occupational History  . Not on file  Tobacco Use  . Smoking status: Former Smoker    Packs/day: 0.33    Years: 7.00    Pack years: 2.31    Types: Cigarettes, E-cigarettes  . Smokeless tobacco: Never Used  . Tobacco comment: uses vape   Vaping Use  . Vaping Use: Every day  Substance and Sexual Activity  . Alcohol use: No    Comment: history of ETOH abuse, 2 months clean now.   . Drug use: No  . Sexual activity: Yes    Birth control/protection: Condom  Other Topics Concern  . Not on file  Social History Narrative  . Not on file   Social Determinants of Health   Financial Resource  Strain:   . Difficulty of Paying Living Expenses:   Food Insecurity:   . Worried About Programme researcher, broadcasting/film/video in the Last Year:   . Barista in the Last Year:   Transportation Needs:   . Freight forwarder (Medical):   Marland Kitchen Lack of Transportation (Non-Medical):   Physical Activity:   . Days of Exercise per Week:   . Minutes of Exercise per Session:   Stress:   . Feeling of Stress :   Social Connections:   . Frequency of Communication with Friends and Family:   . Frequency of Social Gatherings with Friends and Family:   . Attends Religious Services:   . Active Member of Clubs or Organizations:   . Attends Banker Meetings:   Marland Kitchen Marital Status:   Intimate Partner Violence:   . Fear of Current or Ex-Partner:   . Emotionally Abused:   Marland Kitchen Physically Abused:   . Sexually Abused:    Family History  Problem Relation Age of Onset  . Hypertension Mother   . Hypertension Father   . Alzheimer's disease Maternal Grandmother   . Stroke Maternal Grandmother   . Heart attack Maternal Grandmother   . Hypertension Maternal Grandfather   . Hypertension Paternal Grandmother   . Heart attack Paternal  Grandmother   . Lung cancer Paternal Grandmother   . Cancer Paternal Grandfather   . Colon cancer Neg Hx     OBJECTIVE:  Vitals:   06/01/20 1848  BP: (!) 141/87  Pulse: 67  Resp: 20  Temp: 98.4 F (36.9 C)  SpO2: 94%     General appearance: alert; no distress Skin: 2x5 cm induration of his LT groin; tender to touch; no active drainage Psychological: alert and cooperative; normal mood and affect   ASSESSMENT & PLAN:  1. Abscess of groin, left     Meds ordered this encounter  Medications  . DISCONTD: doxycycline (VIBRAMYCIN) 100 MG capsule    Sig: Take 1 capsule (100 mg total) by mouth 2 (two) times daily.    Dispense:  20 capsule    Refill:  0    Order Specific Question:   Supervising Provider    Answer:   Eustace Moore [[id number]]  . doxycycline  (VIBRAMYCIN) 100 MG capsule    Sig: Take 1 capsule (100 mg total) by mouth 2 (two) times daily.    Dispense:  20 capsule    Refill:  0    Order Specific Question:   Supervising Provider    Answer:   Eustace Moore [[id number]]   Apply warm compresses 3-4x daily for 10-15 minutes Wash site daily with warm water and mild soap Keep covered to avoid friction Take antibiotic as prescribed and to completion Follow up here or with PCP if symptoms persists Return or go to the ED if you have any new or worsening symptoms increased redness, swelling, pain, nausea, vomiting, fever, chills, etc...    Reviewed expectations re: course of current medical issues. Questions answered. Outlined signs and symptoms indicating need for more acute intervention. Patient verbalized understanding. After Visit Summary given.          Rennis Harding, PA-C 06/01/20 1913

## 2020-06-03 ENCOUNTER — Other Ambulatory Visit: Payer: Self-pay

## 2020-06-03 ENCOUNTER — Ambulatory Visit
Admission: EM | Admit: 2020-06-03 | Discharge: 2020-06-03 | Disposition: A | Discharge status: Home / Self Care | Payer: Self-pay | Attending: Emergency Medicine | Admitting: Emergency Medicine

## 2020-06-03 DIAGNOSIS — L02214 Cutaneous abscess of groin: Secondary | ICD-10-CM

## 2020-06-03 MED ORDER — CEFTRIAXONE SODIUM 1 G IJ SOLR
1.0000 g | Freq: Once | INTRAMUSCULAR | Status: AC
  Administered 2020-06-03: 1 g via INTRAMUSCULAR

## 2020-06-03 NOTE — Discharge Instructions (Addendum)
Apply warm compresses 3-4x daily for 10-15 minutes Wash site daily with warm water and mild soap Keep covered to avoid friction Take antibiotic as prescribed and to completion Follow up here or with PCP if symptoms persists Return or go to the ED if you have any new or worsening symptoms increased redness, swelling, pain, nausea, vomiting, fever, chills, etc...  

## 2020-06-03 NOTE — ED Triage Notes (Signed)
Pt reports abscess is not improved and painful to walk

## 2020-06-03 NOTE — ED Provider Notes (Signed)
[facility]   [id number] 06/03/20 Arrival Time: 1746   GE:[id number]  SUBJECTIVE:  Kevin Wyatt is a 24 y.o. male who presented to the urgent care with a complaint of possible abscess to left groin for the past 6 days.  Denies any precipitating event.  Was seen on 06/01/2020 and was prescribed doxycycline.  Now is reporting worsening symptoms.  Reported increased pain and difficulty to walk.  Reports similar symptoms in the past.  Denies chills, fever, nausea, vomiting, diarrhea.  ROS: As per HPI.  All other pertinent ROS negative.     Past Medical History:  Diagnosis Date  . ADHD (attention deficit hyperactivity disorder)   . Anxiety   . Dizziness   . GERD (gastroesophageal reflux disease)    w/ prrosis & erucatation  . Nausea   . Obesity   . Vasovagal syncope    Past Surgical History:  Procedure Laterality Date  . None     No Known Allergies No current facility-administered medications on file prior to encounter.   Current Outpatient Medications on File Prior to Encounter  Medication Sig Dispense Refill  . doxycycline (VIBRAMYCIN) 100 MG capsule Take 1 capsule (100 mg total) by mouth 2 (two) times daily. 20 capsule 0  . Omeprazole-Sodium Bicarbonate (ZEGERID) 20-1100 MG CAPS capsule Take 1 capsule by mouth 2 (two) times daily before a meal. 60 each 3   Social History   Socioeconomic History  . Marital status: Single    Spouse name: Not on file  . Number of children: Not on file  . Years of education: Not on file  . Highest education level: Not on file  Occupational History  . Not on file  Tobacco Use  . Smoking status: Former Smoker    Packs/day: 0.33    Years: 7.00    Pack years: 2.31    Types: Cigarettes, E-cigarettes  . Smokeless tobacco: Never Used  . Tobacco comment: uses vape   Vaping Use  . Vaping Use: Every day  Substance and Sexual Activity  . Alcohol use: No    Comment: history of ETOH abuse, 2 months clean now.   . Drug use: No  .  Sexual activity: Yes    Birth control/protection: Condom  Other Topics Concern  . Not on file  Social History Narrative  . Not on file   Social Determinants of Health   Financial Resource Strain:   . Difficulty of Paying Living Expenses:   Food Insecurity:   . Worried About Programme researcher, broadcasting/film/video in the Last Year:   . Barista in the Last Year:   Transportation Needs:   . Freight forwarder (Medical):   Marland Kitchen Lack of Transportation (Non-Medical):   Physical Activity:   . Days of Exercise per Week:   . Minutes of Exercise per Session:   Stress:   . Feeling of Stress :   Social Connections:   . Frequency of Communication with Friends and Family:   . Frequency of Social Gatherings with Friends and Family:   . Attends Religious Services:   . Active Member of Clubs or Organizations:   . Attends Banker Meetings:   Marland Kitchen Marital Status:   Intimate Partner Violence:   . Fear of Current or Ex-Partner:   . Emotionally Abused:   Marland Kitchen Physically Abused:   . Sexually Abused:    Family History  Problem Relation Age of Onset  . Hypertension Mother   . Hypertension Father   .  Alzheimer's disease Maternal Grandmother   . Stroke Maternal Grandmother   . Heart attack Maternal Grandmother   . Hypertension Maternal Grandfather   . Hypertension Paternal Grandmother   . Heart attack Paternal Grandmother   . Lung cancer Paternal Grandmother   . Cancer Paternal Grandfather   . Colon cancer Neg Hx     OBJECTIVE:  Vitals:   06/03/20 1817  BP: 125/83  Pulse: 96  Resp: 18  Temp: 98.8 F (37.1 C)  SpO2: 97%     General appearance: alert; no distress Chest: CTA, heart sounds normal Heart: RRR, no murmur or gallop Skin: 2 x 4 cm induration of his left groin; tender to touch; no active drainage Psychological: alert and cooperative; normal mood and affect  Procedure:   ASSESSMENT & PLAN:  1. Abscess of left groin     Meds ordered this encounter  Medications  .  cefTRIAXone (ROCEPHIN) injection 1 g   Patient is stable at discharge.  He declined incision at this visit.  Rocephin IM was given.  He was advised to continue to take doxycycline as prescribed and to completion.  Abscess without incision and drainage: Apply warm compresses 3-4x daily for 10-15 minutes Wash site daily with warm water and mild soap Keep covered to avoid friction Take antibiotic as prescribed and to completion Follow up here or with PCP if symptoms persists Return or go to the ED if you have any new or worsening symptoms increased redness, swelling, pain, nausea, vomiting, fever, chills, etc...     Reviewed expectations re: course of current medical issues. Questions answered. Outlined signs and symptoms indicating need for more acute intervention. Patient verbalized understanding. After Visit Summary given.       Note: This document was prepared using Dragon voice recognition software and may include unintentional dictation errors.    Durward Parcel, FNP 06/03/20 1830

## 2020-07-26 ENCOUNTER — Emergency Department (HOSPITAL_COMMUNITY)
Admission: EM | Admit: 2020-07-26 | Discharge: 2020-07-27 | Disposition: A | Discharge status: Home / Self Care | Payer: Self-pay | Attending: Emergency Medicine | Admitting: Emergency Medicine

## 2020-07-26 ENCOUNTER — Encounter (HOSPITAL_COMMUNITY): Payer: Self-pay | Admitting: Emergency Medicine

## 2020-07-26 ENCOUNTER — Other Ambulatory Visit: Payer: Self-pay

## 2020-07-26 DIAGNOSIS — F10920 Alcohol use, unspecified with intoxication, uncomplicated: Secondary | ICD-10-CM

## 2020-07-26 DIAGNOSIS — F909 Attention-deficit hyperactivity disorder, unspecified type: Secondary | ICD-10-CM | POA: Insufficient documentation

## 2020-07-26 DIAGNOSIS — F1020 Alcohol dependence, uncomplicated: Secondary | ICD-10-CM | POA: Insufficient documentation

## 2020-07-26 DIAGNOSIS — F332 Major depressive disorder, recurrent severe without psychotic features: Secondary | ICD-10-CM | POA: Insufficient documentation

## 2020-07-26 DIAGNOSIS — R45851 Suicidal ideations: Secondary | ICD-10-CM | POA: Insufficient documentation

## 2020-07-26 DIAGNOSIS — Z87891 Personal history of nicotine dependence: Secondary | ICD-10-CM | POA: Insufficient documentation

## 2020-07-26 LAB — RAPID URINE DRUG SCREEN, HOSP PERFORMED
Amphetamines: NOT DETECTED
Barbiturates: NOT DETECTED
Benzodiazepines: NOT DETECTED
Cocaine: NOT DETECTED
Opiates: NOT DETECTED
Tetrahydrocannabinol: NOT DETECTED
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
## 2020-07-26 NOTE — ED Notes (Signed)
Patient dressed out in purple scrubs. Urine sample was given. Belongings were put in locker. Patient calm and cooperative.

## 2020-07-26 NOTE — ED Provider Notes (Signed)
[facility] EMERGENCY DEPARTMENT Provider Note   CSN: [id number] Arrival date & time: 07/26/20  2042     History Chief Complaint  Patient presents with  .     Kevin Wyatt is a 24 y.o. male.  HPI He presents for evaluation of suicidal ideation, after speaking with his mother on the phone following morning that he had a child that he was unaware of.  He states that someone told him that he had a 62-year-old child, today.  His mother was talking to him she was worried about him committing suicide so she took out commitment papers on him.  He reportedly has alcohol abuse probable tearful recently.  He reportedly drinks alcohol heavily and has been tearful recently.  He denies history of same, and does not see a therapist.  He states he has a job.  No recent illnesses.  There are no other known modifying factors.    Past Medical History:  Diagnosis Date  . ADHD (attention deficit hyperactivity disorder)   . Anxiety   . Dizziness   . GERD (gastroesophageal reflux disease)    w/ prrosis & erucatation  . Nausea   . Obesity   . Vasovagal syncope     Patient Active Problem List   Diagnosis Date Noted  . GERD (gastroesophageal reflux disease) 08/15/2016  . 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 None         Family History  Problem Relation Age of Onset  . Hypertension Mother   . Hypertension Father   . Alzheimer's disease Maternal Grandmother   . Stroke Maternal Grandmother   . Heart attack Maternal Grandmother   . Hypertension Maternal Grandfather   . Hypertension Paternal Grandmother   . Heart attack Paternal Grandmother   . Lung cancer Paternal Grandmother   . Cancer Paternal Grandfather   . Colon cancer Neg Hx     Social History   Tobacco Use  . Smoking status: Former Smoker    Packs/day: 0.33    Years: 7.00    Pack years: 2.31    Types: Cigarettes, E-cigarettes  . Smokeless tobacco: Never  Used  . Tobacco comment: uses vape   Vaping Use  . Vaping Use: Every day  Substance Use Topics  . Alcohol use: No    Comment: history of ETOH abuse, 2 months clean now.   . Drug use: No    Home Medications Prior to Admission medications   Medication Sig Start Date End Date Taking? Authorizing Provider  diazepam (VALIUM) 5 MG tablet Take 5 mg by mouth 2 (two) times daily as needed. 04/24/20  Yes [provider]  omeprazole (PRILOSEC) 40 MG capsule Take 40 mg by mouth 2 (two) times daily. 06/30/20  Yes [provider]  doxycycline (VIBRAMYCIN) 100 MG capsule Take 1 capsule (100 mg total) by mouth 2 (two) times daily. Patient not taking: Reported on 07/26/2020 06/01/20   Wurst, [place], PA-C  Omeprazole-Sodium Bicarbonate (ZEGERID) 20-1100 MG CAPS capsule Take 1 capsule by mouth 2 (two) times daily before a meal. Patient not taking: Reported on 07/26/2020 09/21/16   Gelene Mink, NP    Allergies    Patient has no known allergies.  Review of Systems   Review of Systems  All other systems reviewed and are negative.   Physical Exam Updated Vital Signs BP (!) 144/90 (BP Location: Right Arm)   Pulse 100   Resp 18  Ht 6' (1.829 m)   Wt 113.4 kg   SpO2 98%   BMI 33.91 kg/m   Physical Exam Vitals and nursing note reviewed.  Constitutional:      General: He is not in acute distress.    Appearance: He is well-developed. He is not ill-appearing, toxic-appearing or diaphoretic.  HENT:     Head: Normocephalic and atraumatic.     Right Ear: External ear normal.     Left Ear: External ear normal.  Eyes:     Conjunctiva/sclera: Conjunctivae normal.     Pupils: Pupils are equal, round, and reactive to light.  Neck:     Trachea: Phonation normal.  Cardiovascular:     Rate and Rhythm: Normal rate.  Pulmonary:     Effort: Pulmonary effort is normal.  Abdominal:     General: There is no distension.     Palpations: Abdomen is soft.  Musculoskeletal:         General: Normal range of motion.     Cervical back: Normal range of motion and neck supple.  Skin:    General: Skin is warm and dry.  Neurological:     Mental Status: He is alert and oriented to person, place, and time.     Cranial Nerves: No cranial nerve deficit.     Sensory: No sensory deficit.     Motor: No abnormal muscle tone.     Coordination: Coordination normal.  Psychiatric:        Mood and Affect: Mood normal.        Behavior: Behavior normal.        Thought Content: Thought content normal.        Judgment: Judgment normal.     ED Results / Procedures / Treatments   Labs (all labs ordered are listed, but only abnormal results are displayed) Labs Reviewed  RESPIRATORY PANEL BY RT PCR (FLU A&B, COVID)  RAPID URINE DRUG SCREEN, HOSP PERFORMED  COMPREHENSIVE METABOLIC PANEL  ETHANOL  CBC WITH DIFFERENTIAL/PLATELET    EKG None  Radiology No results found.  Procedures Procedures (including critical care time)  Medications Ordered in ED Medications - No data to display  ED Course  I have reviewed the triage vital signs and the nursing notes.  Pertinent labs & imaging results that were available during my care of the patient were reviewed by me and considered in my medical decision making (see chart for details).  Clinical Course as of Jul 27 22  Mon Jul 27, 2020  0015 Normal  Urine rapid drug screen (hosp performed) [EW]  0019 First examination paperwork filled out by me to uphold IVC petition   [EW]    Clinical Course User Index [EW] Mancel Bale, MD   MDM Rules/Calculators/A&P                           Patient Vitals for the past 24 hrs:  BP Pulse Resp SpO2 Height Weight  07/26/20 2100 (!) 144/90 100 18 98 % -- --  07/26/20 2059 -- -- -- -- 6' (1.829 m) 113.4 kg    12:17 AM Reevaluation with update and discussion. After initial assessment and treatment, an updated evaluation reveals he is resting comfortably.  He has been seen by TTS who  recommend overnight observation.Mancel Bale   Medical Decision Making:  This patient is presenting for evaluation of suicidal ideation with psychosocial stressors, which does require a range of treatment options, and is  a complaint that involves a high risk of morbidity and mortality. The differential diagnoses include depression, suicidal ideation, psychosis. I decided to review old records, and in summary patient with alcohol abuse, presenting with depressive symptoms and reported suicide statement.  He presents under commitment by his mother..  I obtained additional historical information from IVC petition.  Clinical Laboratory Tests Ordered, included CBC, Metabolic panel and Urine drug screen, alcohol level. Review indicates negative UDS.  Critical Interventions-clinical evaluation, laboratory testing, observation reassessment  After These Interventions, the Patient was reevaluated and was found to require overnight observation and management by TTS  CRITICAL CARE-no Performed by: Mancel Bale  Nursing Notes Reviewed/ Care Coordinated Applicable Imaging Reviewed Interpretation of Laboratory Data incorporated into ED treatment  Plan-disposition per TTS in conjunction with oncoming provider team    Final Clinical Impression(s) / ED Diagnoses Final diagnoses:  None    Rx / DC Orders ED Discharge Orders    None       Mancel Bale, MD 07/27/20 [phone]

## 2020-07-26 NOTE — ED Triage Notes (Signed)
Pt brought in by [employer] under IVC. Per pt, he recently found out he has a child, and called his mother stating "he had a bad day and then they came and picked me up and brought me here." pt denies SI/HI, states "what good would I be to my son if I'm dead?"  IVC papers states:  "The respondent told his father that he was just done with life. The respondent said he can not do this (life) anymore. The repondent told his father you are a grandfather and as my father I need you take care of my son. The respondent said hat he needs help with depression and alcoholism. The respondent was crying and drinking alcohol. The respondent is a danger to himself and needs to be examined."

## 2020-07-27 LAB — COMPREHENSIVE METABOLIC PANEL
ALT: 78 U/L — ABNORMAL HIGH (ref 0–44)
AST: 42 U/L — ABNORMAL HIGH (ref 15–41)
Albumin: 4.4 g/dL (ref 3.5–5.0)
Alkaline Phosphatase: 68 U/L (ref 38–126)
Anion gap: 11 (ref 5–15)
BUN: 9 mg/dL (ref 6–20)
CO2: 25 mmol/L (ref 22–32)
Calcium: 9.3 mg/dL (ref 8.9–10.3)
Chloride: 105 mmol/L (ref 98–111)
Creatinine, Ser: 1.08 mg/dL (ref 0.61–1.24)
GFR, Estimated: >60 mL/min (ref >60)
Glucose, Bld: 84 mg/dL (ref 70–99)
Potassium: 4 mmol/L (ref 3.5–5.1)
Sodium: 141 mmol/L (ref 135–145)
Total Bilirubin: 0.4 mg/dL (ref 0.3–1.2)
Total Protein: 8.2 g/dL — ABNORMAL HIGH (ref 6.5–8.1)

## 2020-07-27 LAB — CBC WITH DIFFERENTIAL/PLATELET
Abs Immature Granulocytes: 0.02 10*3/uL (ref 0.00–0.07)
Basophils Absolute: 0 10*3/uL (ref 0.0–0.1)
Basophils Relative: 0 %
Eosinophils Absolute: 0 10*3/uL (ref 0.0–0.5)
Eosinophils Relative: 0 %
HCT: 49.3 % (ref 39.0–52.0)
Hemoglobin: 16.5 g/dL (ref 13.0–17.0)
Immature Granulocytes: 0 %
Lymphocytes Relative: 42 %
Lymphs Abs: 3.1 10*3/uL (ref 0.7–4.0)
MCH: 28.6 pg (ref 26.0–34.0)
MCHC: 33.5 g/dL (ref 30.0–36.0)
MCV: 85.4 fL (ref 80.0–100.0)
Monocytes Absolute: 0.7 10*3/uL (ref 0.1–1.0)
Monocytes Relative: 9 %
Neutro Abs: 3.6 10*3/uL (ref 1.7–7.7)
Neutrophils Relative %: 49 %
Platelets: 381 10*3/uL (ref 150–400)
RBC: 5.77 MIL/uL (ref 4.22–5.81)
RDW: 12.7 % (ref 11.5–15.5)
WBC: 7.5 10*3/uL (ref 4.0–10.5)
nRBC: 0 % (ref 0.0–0.2)

## 2020-07-27 LAB — ETHANOL: Alcohol, Ethyl (B): 157 mg/dL — ABNORMAL HIGH (ref <10)

## 2020-07-27 NOTE — Discharge Instructions (Signed)
Residential Treatment  Facilities Medicaid Detox No Insurance Engineer, site ([facility]) [address] [place], [place] [phone] or  [phone]   No  Yes  Yes  Yes    [facility] [phone] [place], [place] [address] [phone] Admissions: 8am-3pm  M-F   [place] only  No  Yes  No    [facility] [phone]   No  Yes  No- out of pocket 16,000  Yes   [facility] ([facility]) [address] [place], [place] [phone]   Yes- No medicare  Yes   Yes, [place]   [address] only    [address] [place], [place], [address] [phone]            No  No  Yes but private pay, offers some sponsorships  Does not take insurance   Residential Treatment  Facilities Medicaid Detox No Insurance Private Insurance   [facility]  [facility] [place], [place] [phone] (takes everyone as long as they meet detox criteria)   Yes  Yes   Yes  Yes   [address] [place], [place]  [phone] 27 locations    No  No- sober living house  90.00-130.00 per week per person  [facility] Part of [place] Outreach [phone] [email or web address] .Lorayne Marek [place] [facility] Part of [place] Outreach [phone] [email or web address]    No   [facility] [address]  [place] [phone] [email or web address]   No  No  1,200.00 a 200.00 deposit is required at start of treatment Payment plans accepted [facility] based program  No   [facility] [address]  [place], [place], [address] [phone]        No  Yes       Yes  Regular Rehab: 7,500.00 28 days Dual Diagnosis: 8,900.00 28 days 7 day detox: 2.700.00 or 3,400.00 for Dual Diagnosis     Yes   Residential Treatment  Facilities *out of state     Medicaid  Detox  No Insurance  Private Insurance   [facility] *out of state facilities [phone]   No  Yes  Private Pay  Yes    [facility] *out of state facilities  [phone]   No  Yes  Private Pay   Yes                  Outpatient Treatment  Facilities Medicaid Detox No Insurance Private  Insurance   [facility] [address]  [place], [place], [address] [phone]   No  No  No  Yes    [facility]  (If substance abuse is secondary diagnosis) [address],  [place], [place] [address] [phone]   Yes-[place] Only for Partial   No   No   Yes- IOP   [facility] [address]  [place], [place], [address] [phone]   Yes  They would go to ER at [facility] then be transferred to a detox unit    Yes- self pay     Yes    [facility]  [address]  [name], [place], [address] And  [address],  [place], [place] [address] [phone]           Yes  No    Yes most qualify for state funding  IOP and  Opiod treatment- Offers Methadone    UHC, Nehemiah Settle, [place]  Outpatient Treatment  Facilities Medicaid Detox No [employer]  [facility] [address] [place], [place], [address] [phone] (suboxone)  YES No Private Pay Medicaid only   [facility] [address] [place], [place],  [phone]   Yes but not for suboxone treatment  Yes- opiates with suboxone have to commit to 8 week IOP   Yes 595.00 for first visit  150.00 for prescription 150.00 a week after that for group    Yes    [facility] [address]  [place], [place] [phone]  No- has a waiting list about to be approved No Yes- but has to be self pay  500.00 for 1st 2 weeks  500 for next 2 weeks and  750 mo.  Ongoing Yes    [facility] [phone]  [address] [place], [place] [phone]  No No Limited sponsorships IOP- 9,500.00 8-15 weeks If paid upfront gives a 500.00 deduction Outpatient- 1 day a week  9 weeks 4,500.00 has payment plans   Yes- out of network though   [facility]Groups/Residential) [place], [place]  [phone]  Yes- [place] No IOP facility  Yes  No  Outpatient Treatment  Facilities Medicaid Detox No [facility]   [place] [facility]  [address]  [place], [place] [address] [phone]   Yes   No    Yes

## 2020-07-27 NOTE — ED Notes (Signed)
TTS in progress 

## 2020-07-27 NOTE — ED Provider Notes (Signed)
Emergency Medicine Observation Re-evaluation Note  Kevin Wyatt is a 24 y.o. male, seen on rounds today.  Pt initially presented to the ED for complaints of alcohol intoxication and SI.  Currently, the patient is resting comfortably.  Physical Exam  BP 121/61 (BP Location: Right Arm)   Pulse 87   Temp 98.2 F (36.8 C) (Oral)   Resp 17   Ht 6' (1.829 m)   Wt 113.4 kg   SpO2 98%   BMI 33.91 kg/m  Physical Exam General: No distress Lungs: Normal effort Psych: calm and cooperative  ED Course / MDM  EKG:  Clinical Course as of Jul 27 1337  Mon Jul 27, 2020  0015 Normal  Urine rapid drug screen (hosp performed) [EW]  0019 First examination paperwork filled out by me to uphold IVC petition   [EW]    Clinical Course User Index [EW] Mancel Bale, MD   I have reviewed the labs performed to date as well as medications administered while in observation.  Recent changes in the last 24 hours include Slept in the ED most of the morning.  Plan  Current plan is for discharge per psychiatry. Patient is under full IVC at this time, will be rescinded prior to discharge.    Pollyann Savoy, MD 07/27/20 1340

## 2020-07-27 NOTE — ED Notes (Signed)
[facility] called and states pt is psych cleared.  
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
## 2020-07-27 NOTE — ED Notes (Signed)
Pt mother called and states [facility] called her and states he is to be released home today. Mother states she does not feel safe with him at home and if he does not decide to get help with [facility] he will be homeless. Pt mother states he has a drinking problem and becomes violent. Mother states she has discussed all of this with [facility]. Pt states he is going to follow up with [facility] today and plans to seek help with outpatient therapy.

## 2020-07-27 NOTE — Progress Notes (Signed)
Pt has been psychiatrically cleared. Outpatient substance use/mental health resources have been placed in his AVS.   Wells Guiles, MSW, LCSW, LCAS Clinical Social Worker II Disposition CSW [phone]

## 2020-12-26 ENCOUNTER — Other Ambulatory Visit: Payer: Self-pay

## 2020-12-26 ENCOUNTER — Emergency Department (HOSPITAL_COMMUNITY): Payer: Self-pay

## 2020-12-26 ENCOUNTER — Emergency Department (HOSPITAL_COMMUNITY)
Admission: EM | Admit: 2020-12-26 | Discharge: 2020-12-26 | Discharge status: Against Medical Advice | Payer: Self-pay | Attending: Emergency Medicine | Admitting: Emergency Medicine

## 2020-12-26 ENCOUNTER — Encounter (HOSPITAL_COMMUNITY): Payer: Self-pay | Admitting: Emergency Medicine

## 2020-12-26 DIAGNOSIS — R079 Chest pain, unspecified: Secondary | ICD-10-CM | POA: Insufficient documentation

## 2020-12-26 DIAGNOSIS — F1510 Other stimulant abuse, uncomplicated: Secondary | ICD-10-CM | POA: Insufficient documentation

## 2020-12-26 DIAGNOSIS — R000 Tachycardia, unspecified: Secondary | ICD-10-CM | POA: Insufficient documentation

## 2020-12-26 DIAGNOSIS — R030 Elevated blood-pressure reading, without diagnosis of hypertension: Secondary | ICD-10-CM | POA: Insufficient documentation

## 2020-12-26 DIAGNOSIS — R748 Abnormal levels of other serum enzymes: Secondary | ICD-10-CM | POA: Insufficient documentation

## 2020-12-26 DIAGNOSIS — M6282 Rhabdomyolysis: Secondary | ICD-10-CM | POA: Insufficient documentation

## 2020-12-26 DIAGNOSIS — R110 Nausea: Secondary | ICD-10-CM | POA: Insufficient documentation

## 2020-12-26 DIAGNOSIS — R0602 Shortness of breath: Secondary | ICD-10-CM | POA: Insufficient documentation

## 2020-12-26 DIAGNOSIS — Z87891 Personal history of nicotine dependence: Secondary | ICD-10-CM | POA: Insufficient documentation

## 2020-12-26 LAB — CBC WITH DIFFERENTIAL/PLATELET
Abs Immature Granulocytes: 0.03 10*3/uL (ref 0.00–0.07)
Basophils Absolute: 0 10*3/uL (ref 0.0–0.1)
Basophils Relative: 0 %
Eosinophils Absolute: 0 10*3/uL (ref 0.0–0.5)
Eosinophils Relative: 0 %
HCT: 42.6 % (ref 39.0–52.0)
Hemoglobin: 15 g/dL (ref 13.0–17.0)
Immature Granulocytes: 0 %
Lymphocytes Relative: 19 %
Lymphs Abs: 2.3 10*3/uL (ref 0.7–4.0)
MCH: 29.3 pg (ref 26.0–34.0)
MCHC: 35.2 g/dL (ref 30.0–36.0)
MCV: 83.2 fL (ref 80.0–100.0)
Monocytes Absolute: 1.7 10*3/uL — ABNORMAL HIGH (ref 0.1–1.0)
Monocytes Relative: 14 %
Neutro Abs: 8.1 10*3/uL — ABNORMAL HIGH (ref 1.7–7.7)
Neutrophils Relative %: 67 %
Platelets: 322 10*3/uL (ref 150–400)
RBC: 5.12 MIL/uL (ref 4.22–5.81)
RDW: 12.6 % (ref 11.5–15.5)
WBC: 12.2 10*3/uL — ABNORMAL HIGH (ref 4.0–10.5)
nRBC: 0 % (ref 0.0–0.2)

## 2020-12-26 LAB — TROPONIN I (HIGH SENSITIVITY)
Troponin I (High Sensitivity): 13 ng/L (ref <18)
Troponin I (High Sensitivity): 12 ng/L (ref <18)
Troponin I (High Sensitivity): 11 ng/L (ref <18)
Troponin I (High Sensitivity): 11 ng/L (ref <18)

## 2020-12-26 LAB — COMPREHENSIVE METABOLIC PANEL
ALT: 58 U/L — ABNORMAL HIGH (ref 0–44)
AST: 72 U/L — ABNORMAL HIGH (ref 15–41)
Albumin: 4.5 g/dL (ref 3.5–5.0)
Alkaline Phosphatase: 82 U/L (ref 38–126)
Anion gap: 15 (ref 5–15)
BUN: 17 mg/dL (ref 6–20)
CO2: 22 mmol/L (ref 22–32)
Calcium: 9.3 mg/dL (ref 8.9–10.3)
Chloride: 99 mmol/L (ref 98–111)
Creatinine, Ser: 1 mg/dL (ref 0.61–1.24)
GFR, Estimated: >60 mL/min (ref >60)
Glucose, Bld: 96 mg/dL (ref 70–99)
Potassium: 2.9 mmol/L — ABNORMAL LOW (ref 3.5–5.1)
Sodium: 136 mmol/L (ref 135–145)
Total Bilirubin: 1.3 mg/dL — ABNORMAL HIGH (ref 0.3–1.2)
Total Protein: 7.6 g/dL (ref 6.5–8.1)

## 2020-12-26 LAB — D-DIMER, QUANTITATIVE: D-Dimer, Quant: 1 ug/mL-FEU — ABNORMAL HIGH (ref 0.00–0.50)

## 2020-12-26 LAB — CK
Total CK: 3329 U/L — ABNORMAL HIGH (ref 49–397)
Total CK: 2636 U/L — ABNORMAL HIGH (ref 49–397)
Total CK: 2415 U/L — ABNORMAL HIGH (ref 49–397)

## 2020-12-26 LAB — SALICYLATE LEVEL: Salicylate Lvl: <7 mg/dL — ABNORMAL LOW (ref 7.0–30.0)

## 2020-12-26 LAB — ACETAMINOPHEN LEVEL: Acetaminophen (Tylenol), Serum: <10 ug/mL — ABNORMAL LOW (ref 10–30)

## 2020-12-26 LAB — ETHANOL: Alcohol, Ethyl (B): <10 mg/dL (ref <10)

## 2020-12-26 MED ORDER — LORAZEPAM 2 MG/ML IJ SOLN: 0.0000 mg | Freq: Two times a day (BID) | INTRAMUSCULAR | Status: DC

## 2020-12-26 MED ORDER — POTASSIUM CHLORIDE CRYS ER 20 MEQ PO TBCR: 20.0000 meq | EXTENDED_RELEASE_TABLET | Freq: Two times a day (BID) | ORAL | 0 refills | Status: DC

## 2020-12-26 MED ORDER — POTASSIUM CHLORIDE CRYS ER 20 MEQ PO TBCR
40.0000 meq | EXTENDED_RELEASE_TABLET | Freq: Once | ORAL | Status: AC
  Administered 2020-12-26: 40 meq via ORAL
  Filled 2020-12-26: qty 2

## 2020-12-26 MED ORDER — THIAMINE HCL 100 MG/ML IJ SOLN: 100.0000 mg | Freq: Every day | INTRAMUSCULAR | Status: DC

## 2020-12-26 MED ORDER — POTASSIUM CHLORIDE 10 MEQ/100ML IV SOLN
10.0000 meq | INTRAVENOUS | Status: AC
  Administered 2020-12-26 (×2): 10 meq via INTRAVENOUS
  Filled 2020-12-26 (×2): qty 100

## 2020-12-26 MED ORDER — SODIUM CHLORIDE 0.9 % IV BOLUS
1000.0000 mL | Freq: Once | INTRAVENOUS | Status: AC
  Administered 2020-12-26: 1000 mL via INTRAVENOUS

## 2020-12-26 MED ORDER — SODIUM CHLORIDE 0.9 % IV BOLUS
2000.0000 mL | Freq: Once | INTRAVENOUS | Status: AC
  Administered 2020-12-26: 2000 mL via INTRAVENOUS

## 2020-12-26 MED ORDER — LORAZEPAM 1 MG PO TABS: 1.0000 mg | ORAL_TABLET | ORAL | Status: DC | PRN

## 2020-12-26 MED ORDER — ADULT MULTIVITAMIN W/MINERALS CH: 1.0000 | ORAL_TABLET | Freq: Every day | ORAL | Status: DC

## 2020-12-26 MED ORDER — THIAMINE HCL 100 MG PO TABS: 100.0000 mg | ORAL_TABLET | Freq: Every day | ORAL | Status: DC

## 2020-12-26 MED ORDER — MIDAZOLAM HCL 2 MG/2ML IJ SOLN
4.0000 mg | Freq: Once | INTRAMUSCULAR | Status: AC
  Administered 2020-12-26: 4 mg via INTRAVENOUS
  Filled 2020-12-26: qty 4

## 2020-12-26 MED ORDER — IOHEXOL 350 MG/ML SOLN
100.0000 mL | Freq: Once | INTRAVENOUS | Status: AC | PRN
  Administered 2020-12-26: 100 mL via INTRAVENOUS

## 2020-12-26 MED ORDER — LORAZEPAM 2 MG/ML IJ SOLN: 0.0000 mg | Freq: Four times a day (QID) | INTRAMUSCULAR | Status: DC

## 2020-12-26 MED ORDER — LORAZEPAM 2 MG/ML IJ SOLN: 1.0000 mg | INTRAMUSCULAR | Status: DC | PRN

## 2020-12-26 MED ORDER — LORAZEPAM 2 MG/ML IJ SOLN
1.0000 mg | Freq: Once | INTRAMUSCULAR | Status: AC
  Administered 2020-12-26: 1 mg via INTRAVENOUS
  Filled 2020-12-26: qty 1

## 2020-12-26 MED ORDER — MIDAZOLAM HCL 2 MG/2ML IJ SOLN
2.0000 mg | Freq: Once | INTRAMUSCULAR | Status: AC
  Administered 2020-12-26: 2 mg via INTRAVENOUS
  Filled 2020-12-26: qty 2

## 2020-12-26 MED ORDER — FOLIC ACID 1 MG PO TABS: 1.0000 mg | ORAL_TABLET | Freq: Every day | ORAL | Status: DC

## 2020-12-26 NOTE — ED Triage Notes (Signed)
Pt c/o chest pain that started about 2 hours ago. Pt admits to eating crystal meth (2 bags) for the past few days.

## 2020-12-26 NOTE — ED Provider Notes (Addendum)
[facility] Provider Note   CSN: [id number] Arrival date & time: 12/26/20  0121     History Chief Complaint  Patient presents with  . Chest Pain    Kevin DanceKeith W Wyatt is a 25 y.o. male.  Level 5 caveat for agitation.  Patient here with central chest pain that started about 2 hours ago while he was at home resting.  He describes nausea with shortness of breath and pain in the center of his chest that is constant without radiation.  He admits to using crystal meth over the past 2 days by mouth.  States he has not used this drug in the past.  He is not certain how much he is used. estimates 1 or 2 bags.  Denies any suicidal attempt.  Denies any cocaine or alcohol use.  Denies any cardiac history. Chest pain started several hours after using meth tonight. Has had nausea but no vomiting.  Sweating but no fever.  No cough.  No leg pain or leg swelling.  No abdominal pain or back pain. Denies any cardiac history.  The history is provided by the patient.  Chest Pain Associated symptoms: nausea and shortness of breath   Associated symptoms: no abdominal pain, no cough, no dizziness, no fever, no headache, no vomiting and no weakness        Past Medical History:  Diagnosis Date  . ADHD (attention deficit hyperactivity disorder)   . Anxiety   . Dizziness   . GERD (gastroesophageal reflux disease)    w/ prrosis & erucatation  . Nausea   . Obesity   . Vasovagal syncope     Patient Active Problem List   Diagnosis Date Noted  . GERD (gastroesophageal reflux disease) 08/15/2016  . 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 None         Family History  Problem Relation Age of Onset  . Hypertension Mother   . Hypertension Father   . Alzheimer's disease Maternal Grandmother   . Stroke Maternal Grandmother   . Heart attack Maternal Grandmother   . Hypertension Maternal Grandfather   . Hypertension  Paternal Grandmother   . Heart attack Paternal Grandmother   . Lung cancer Paternal Grandmother   . Cancer Paternal Grandfather   . Colon cancer Neg Hx     Social History   Tobacco Use  . Smoking status: Former Smoker    Packs/day: 0.33    Years: 7.00    Pack years: 2.31    Types: Cigarettes, E-cigarettes  . Smokeless tobacco: Never Used  . Tobacco comment: uses vape   Vaping Use  . Vaping Use: Every day  Substance Use Topics  . Alcohol use: No    Comment: history of ETOH abuse, 2 months clean now.   . Drug use: No    Home Medications Prior to Admission medications   Medication Sig Start Date End Date Taking? Authorizing Provider  diazepam (VALIUM) 5 MG tablet Take 5 mg by mouth 2 (two) times daily as needed. 04/24/20   [provider]  omeprazole (PRILOSEC) 40 MG capsule Take 40 mg by mouth 2 (two) times daily. 06/30/20   [provider]  Omeprazole-Sodium Bicarbonate (ZEGERID) 20-1100 MG CAPS capsule Take 1 capsule by mouth 2 (two) times daily before a meal. Patient not taking: Reported on 07/26/2020 09/21/16 07/27/20  Gelene MinkBoone, Anna W, NP    Allergies    Patient has no known allergies.  Review of Systems   Review of Systems  Constitutional: Negative for activity change, appetite change and fever.  HENT: Negative for congestion and rhinorrhea.   Respiratory: Positive for chest tightness and shortness of breath. Negative for cough.   Cardiovascular: Positive for chest pain.  Gastrointestinal: Positive for nausea. Negative for abdominal pain and vomiting.  Genitourinary: Negative for dysuria and hematuria.  Musculoskeletal: Negative for arthralgias and myalgias.  Neurological: Negative for dizziness, weakness and headaches.   all other systems are negative except as noted in the HPI and PMH.   Physical Exam Updated Vital Signs BP (!) 148/112   Pulse (!) 128   Temp 99.3 F (37.4 C) (Oral)   Resp (!) 25   Ht  (1.803 m)   Wt 116.1 kg   SpO2 97%    BMI 35.70 kg/m   Physical Exam Vitals and nursing note reviewed.  Constitutional:      General: He is not in acute distress.    Appearance: He is well-developed.     Comments: Tremulous and agitated  HENT:     Head: Normocephalic and atraumatic.     Mouth/Throat:     Pharynx: No oropharyngeal exudate.  Eyes:     Conjunctiva/sclera: Conjunctivae normal.     Pupils: Pupils are equal, round, and reactive to light.  Neck:     Comments: No meningismus. Cardiovascular:     Rate and Rhythm: Regular rhythm. Tachycardia present.     Heart sounds: Normal heart sounds. No murmur heard.     Comments: Equal radial pulses and grip strengths. Pulmonary:     Effort: Pulmonary effort is normal. No respiratory distress.     Breath sounds: Normal breath sounds.  Abdominal:     Palpations: Abdomen is soft.     Tenderness: There is no abdominal tenderness. There is no guarding or rebound.  Musculoskeletal:        General: No tenderness. Normal range of motion.     Cervical back: Normal range of motion and neck supple.  Skin:    General: Skin is warm.  Neurological:     General: No focal deficit present.     Mental Status: He is alert and oriented to person, place, and time. Mental status is at baseline.     Cranial Nerves: No cranial nerve deficit.     Motor: No abnormal muscle tone.     Coordination: Coordination normal.     Comments: No ataxia on finger to nose bilaterally. No pronator drift. 5/5 strength throughout. CN 2-12 intact.Equal grip strength. Sensation intact.   Psychiatric:        Behavior: Behavior normal.     ED Results / Procedures / Treatments   Labs (all labs ordered are listed, but only abnormal results are displayed) Labs Reviewed  CBC WITH DIFFERENTIAL/PLATELET - Abnormal; Notable for the following components:      Result Value   WBC 12.2 (*)    Neutro Abs 8.1 (*)    Monocytes Absolute 1.7 (*)    All other components within normal limits  COMPREHENSIVE  METABOLIC PANEL - Abnormal; Notable for the following components:   Potassium 2.9 (*)    AST 72 (*)    ALT 58 (*)    Total Bilirubin 1.3 (*)    All other components within normal limits  ACETAMINOPHEN LEVEL - Abnormal; Notable for the following components:   Acetaminophen (Tylenol), Serum <10 (*)    All other components within normal limits  SALICYLATE LEVEL -  Abnormal; Notable for the following components:   Salicylate Lvl <7.0 (*)    All other components within normal limits  CK - Abnormal; Notable for the following components:   Total CK 3,329 (*)    All other components within normal limits  D-DIMER, QUANTITATIVE - Abnormal; Notable for the following components:   D-Dimer, Quant 1.00 (*)    All other components within normal limits  CK - Abnormal; Notable for the following components:   Total CK 2,636 (*)    All other components within normal limits  CK - Abnormal; Notable for the following components:   Total CK 2,415 (*)    All other components within normal limits  ETHANOL  TROPONIN I (HIGH SENSITIVITY)  TROPONIN I (HIGH SENSITIVITY)  TROPONIN I (HIGH SENSITIVITY)  TROPONIN I (HIGH SENSITIVITY)    EKG EKG Interpretation  Date/Time:  Saturday December 26 2020 01:38:54 EST Ventricular Rate:  122 PR Interval:    QRS Duration: 96 QT Interval:  338 QTC Calculation: 482 R Axis:   61 Text Interpretation: Sinus tachycardia Borderline T abnormalities, inferior leads Borderline prolonged QT interval Baseline wander in lead(s) V2 Rate faster Confirmed by Glynn Octave [phone]) on 12/26/2020 1:43:19 AM   Radiology DG Chest Portable 1 View  Result Date: 12/26/2020 CLINICAL DATA:  Chest pain for 2 hours EXAM: PORTABLE CHEST 1 VIEW COMPARISON:  07/01/2016 FINDINGS: The heart size and mediastinal contours are within normal limits. Both lungs are clear. The visualized skeletal structures are unremarkable. IMPRESSION: No active disease. Electronically Signed   By: Sharlet Salina  M.D.   On: 12/26/2020 02:02   CT Angio Chest/Abd/Pel for Dissection W and/or Wo Contrast  Result Date: 12/26/2020 CLINICAL DATA:  25 year old male with 2 days of sharp chest pain. Anxiety. EXAM: CT ANGIOGRAPHY CHEST, ABDOMEN AND PELVIS TECHNIQUE: Non-contrast CT of the chest was initially obtained. Multidetector CT imaging through the chest, abdomen and pelvis was performed using the standard protocol during bolus administration of intravenous contrast. Multiplanar reconstructed images and MIPs were obtained and reviewed to evaluate the vascular anatomy. CONTRAST:  OMNIPAQUE IOHEXOL 350 MG/ML SOLN COMPARISON:  Portable chest 0149 hours today. Abdominal radiograph 11/02/2004. FINDINGS: CTA CHEST FINDINGS Cardiovascular: No calcified atherosclerosis in the chest. Normal thoracic aorta. No cardiomegaly or pericardial effusion. Late contrast timing in the pulmonary arteries, but the central PA appears to be patent. Mediastinum/Nodes: Negative. No mediastinal hematoma or lymphadenopathy. Lungs/Pleura: Mildly low lung volumes. Major airways are patent. Minor dependent atelectasis in both lungs, but otherwise the lungs are clear. No pleural effusion. Musculoskeletal: No acute osseous abnormality identified. Minimal thoracic scoliosis. Review of the MIP images confirms the above findings. CTA ABDOMEN AND PELVIS FINDINGS VASCULAR Normal caliber abdominal aorta which is patent along with its major branches. Patent and normal iliofemoral arteries. No atherosclerosis, dissection or aneurysm identified. Review of the MIP images confirms the above findings. NON-VASCULAR Hepatobiliary: Streak artifact related to the upper extremities. Hepatic steatosis suspected, but otherwise negative liver and gallbladder. No bile duct enlargement. Pancreas: Negative. Spleen: Streak artifact, negative. Adrenals/Urinary Tract: Normal adrenal glands. Kidneys enhance symmetrically and appear normal. Motion artifact at both renal lower  poles. No hydronephrosis or hydroureter. Pelvic phleboliths. Negative urinary bladder. Stomach/Bowel: Intermittent mild motion in the abdomen. No large bowel inflammation, and evidence of normal retrocecal appendix on series 6, image 154. No dilated small bowel. Small volume of oral contrast in the stomach which along with the duodenum is fairly decompressed. No free air or free fluid identified. No  mesenteric stranding. Lymphatic: No lymphadenopathy. Reproductive: Negative. Other: No pelvic free fluid. Musculoskeletal: Dextroconvex scoliosis. Chronic L4 superior endplate Schmorl's node (degenerative). Scattered small benign bone islands suspected in the pelvis. No acute osseous abnormality identified. Review of the MIP images confirms the above findings. IMPRESSION: 1. Negative CTA; Normal aorta. No evidence of atherosclerosis, dissection, or aneurysm. 2. Mild pulmonary atelectasis. 3. Hepatic steatosis. 4. Dextroconvex scoliosis. Electronically Signed   By: Odessa Fleming M.D.   On: 12/26/2020 04:12    Procedures .Critical Care Performed by: Glynn Octave, MD Authorized by: Glynn Octave, MD   Critical care provider statement:    Critical care time (minutes):  45   Critical care was necessary to treat or prevent imminent or life-threatening deterioration of the following conditions:  Metabolic crisis and dehydration   Critical care was time spent personally by me on the following activities:  Discussions with consultants, evaluation of patient's response to treatment, examination of patient, ordering and performing treatments and interventions, ordering and review of laboratory studies, ordering and review of radiographic studies, pulse oximetry, re-evaluation of patient's condition, obtaining history from patient or surrogate and review of old charts     Medications Ordered in ED Medications  LORazepam (ATIVAN) injection 1 mg (has no administration in time range)    ED Course  I have reviewed  the triage vital signs and the nursing notes.  Pertinent labs & imaging results that were available during my care of the patient were reviewed by me and considered in my medical decision making (see chart for details).    MDM Rules/Calculators/A&P                         Central chest pain after using methamphetamine.  Tachycardic and hypertensive.  EKG is sinus rhythm without acute ST changes.  Patient given IV Ativan, chest x-ray labs will be obtained.  Chest x-ray is negative.  First troponin is negative.  Patient aggressively hydrated.  CK elevated at 3000. Potassium supplement  Patient required additional doses of benzodiazepines for restlessness and agitation.  Patient feeling improved after benzodiazepines.  Blood pressure and heart rate are improving.  He is less tremulous.  CT is negative for aortic dissection or large pulmonary embolism.  Discussed with Dr. Margo Aye  CK is downtrending after hydration.  Troponin remains negative.  Chest pain has improved. Potassium replaced. Creatinine normal.  CK downtrending to 2400.  Encouraged oral hydration at home.   Patient admits he made a mistake and states he is not suicidal or homicidal.  He is aggressively suggested to avoid methamphetamine use or other illicit drug use in the future.  Follow-up with PCP.  Return to the ED with exertional chest pain, shortness of breath, nausea, vomiting, diaphoresis or other concerns. Should have recheck of CK as well as kidney function next week by PCP.  Addendum: On attempted discharge, patient confused, agitated and tremulous again. Hx alcohol abuse in the past but patient stated he hasn't had any for several months. Nevertheless, there is concern for alcohol withdrawal in addition to continued meth intoxication. CIWA 6. Thiamine, folate and additional benzodiazepines ordered.  Discharge cancelled. Dr. Wilkie Aye to evaluate for potential admission.  Final Clinical Impression(s) / ED  Diagnoses Final diagnoses:  Nonspecific chest pain  Methamphetamine abuse (HCC)  Non-traumatic rhabdomyolysis    Rx / DC Orders ED Discharge Orders    None       Daymein Nunnery, Jeannett Senior, MD 12/26/20 3086    Mykalah Saari,  Jeannett Senior, MD 12/26/20 [phone]

## 2020-12-26 NOTE — ED Notes (Signed)
Pt is very restless and yelling in his room. Pt is stating that his chest is hurting. EDP made aware and medications to be ordered.

## 2020-12-26 NOTE — ED Notes (Signed)
Waiting for pt's IVF bolus to finish prior to discharging pt. Pt is having a difficult time remembering to keep his arm straight in order for the fluids to infuse and to keep his cardiac monitor leads and BP cords attached. Pt reminded multiple times but forgets quickly. Pt has been found to be walking around the room attempting to remove his IV. When RN goes into room pt states, "I thought they were finished". Pt disoriented intermittently. Pt does walk with steady gait. Pt continuously reminded to remain in bed and keep arm straight. Arm board placed on pt's arm with IV. Will continue to monitor.

## 2020-12-26 NOTE — ED Provider Notes (Signed)
Patient originally seen by overnight doctor.  Originally presented for chest pain after reported methamphetamine use.  Patient had no ischemic changes on blood work or EKG, he was given Ativan for agitation.  His work-up was unremarkable, patient had improvement of labs, denied any SI/HI, was educated on the dangers of illicit drug use and the plan was for discharge.  Upon discharge the nursing staff had concern that the patient was acting "bizarre".  He seemed confused at times, and changed his story that the last time he used methamphetamines would have been 3 days ago.  He admits that he does drink alcohol daily but his last use would have been 3 days ago.  He was found to be tachycardic and hypertensive, concern for alcohol withdrawal.  I was asked to reevaluate the patient for safety of discharge. Physical Exam  BP (!) 140/98 (BP Location: Left Arm)   Pulse (!) 106   Temp 99.3 F (37.4 C) (Oral)   Resp 20   Ht 5\' 11"  (1.803 m)   Wt 116.1 kg   SpO2 100%   BMI 35.70 kg/m   Physical Exam Vitals and nursing note reviewed.  Constitutional:      Appearance: Normal appearance. He is not ill-appearing or toxic-appearing.  HENT:     Head: Normocephalic.     Mouth/Throat:     Mouth: Mucous membranes are moist.  Cardiovascular:     Rate and Rhythm: Tachycardia present.  Pulmonary:     Effort: Pulmonary effort is normal. No respiratory distress.  Abdominal:     Palpations: Abdomen is soft.     Tenderness: There is no abdominal tenderness.  Skin:    General: Skin is warm.  Neurological:     Mental Status: He is alert and oriented to person, place, and time. Mental status is at baseline.  Psychiatric:        Mood and Affect: Mood is anxious.     ED Course/Procedures     Procedures  MDM  On my evaluation patient story of his most recent drug and alcohol abuse does constantly change.  I expressed my concern that he could be in acute alcohol withdrawal but the patient disagrees with  this.  He states that he just recently found out that he had a 27-year-old son and he has been very anxious and upset about this.  He admits that this is what brought on the recent drug use.  He denies any thoughts of hurting himself, SI/HI.  He states that he wants to go home and rest.  I expressed our concern for his odd behavior and possible alcohol withdrawal.  I recommended admission for further medication and monitoring.  He declines.  He has been sitting up and drinking, low suspicion for hypoglycemia but I recommended a fingerstick.  The patient also declined this.  Patient is otherwise pleasant and cooperative.  He is alert and oriented x3, appears to have capacity to make appropriate medical decisions.  He has a steady gait.  I again recommended admission and monitoring, I offered psychiatric eval and further care.  He has pleasantly declined and is requesting to go home.  He understand that this would mean he is leaving AGAINST MEDICAL ADVICE.  I have recommended that the patient stays in the department.  I have expressed the importance of staying including the risks of leaving which include worsening condition, permanent disability and death.  Patient accepts these risks.  They are alert and oriented, they have capacity to make  this decision.  I have not been able to convince the patient to stay and they understand the risks of leaving AGAINST MEDICAL ADVICE.  Patient ambulated out of the department independently with no issues.        Rozelle Logan, DO 12/26/20 2549

## 2020-12-26 NOTE — ED Notes (Addendum)
Pt is still restless and will not hold arm still for fluids and medications to go through. EDP made aware and restraints to be ordered.

## 2020-12-26 NOTE — Discharge Instructions (Addendum)
You have chosen to leave the emergency department AGAINST MEDICAL ADVICE.  I have explained to you your testing results and the need for further evaluation/admission.  You have verbalized understanding of these results and plan and are choosing to leave the hospital AGAINST MEDICAL ADVICE. You understand the risks associated with leaving without further evaluation/treatment. If you have any worsening symptoms or choose to be treated please return immediately to emergency department.  Follow-up with your doctor next week to have a check of your kidney function as well as a blood test called CK. Drink 6-8 glasses of water a day to continue to flush your kidneys.   Stop using methamphetamine and other illicit drugs. Follow up with your doctor. Return to the ED if you develop exertional chest pain, or chest pain associated with shortness of breath, vomiting, sweating, or any other concerns.

## 2021-04-16 IMAGING — DX DG CHEST 1V PORT
1 series · 1 of 1 positions shown · non-contrast
Comparison: 07/01/2016

CLINICAL DATA: Chest pain for 2 hours

EXAM:
PORTABLE CHEST 1 VIEW

[chest ap grid]
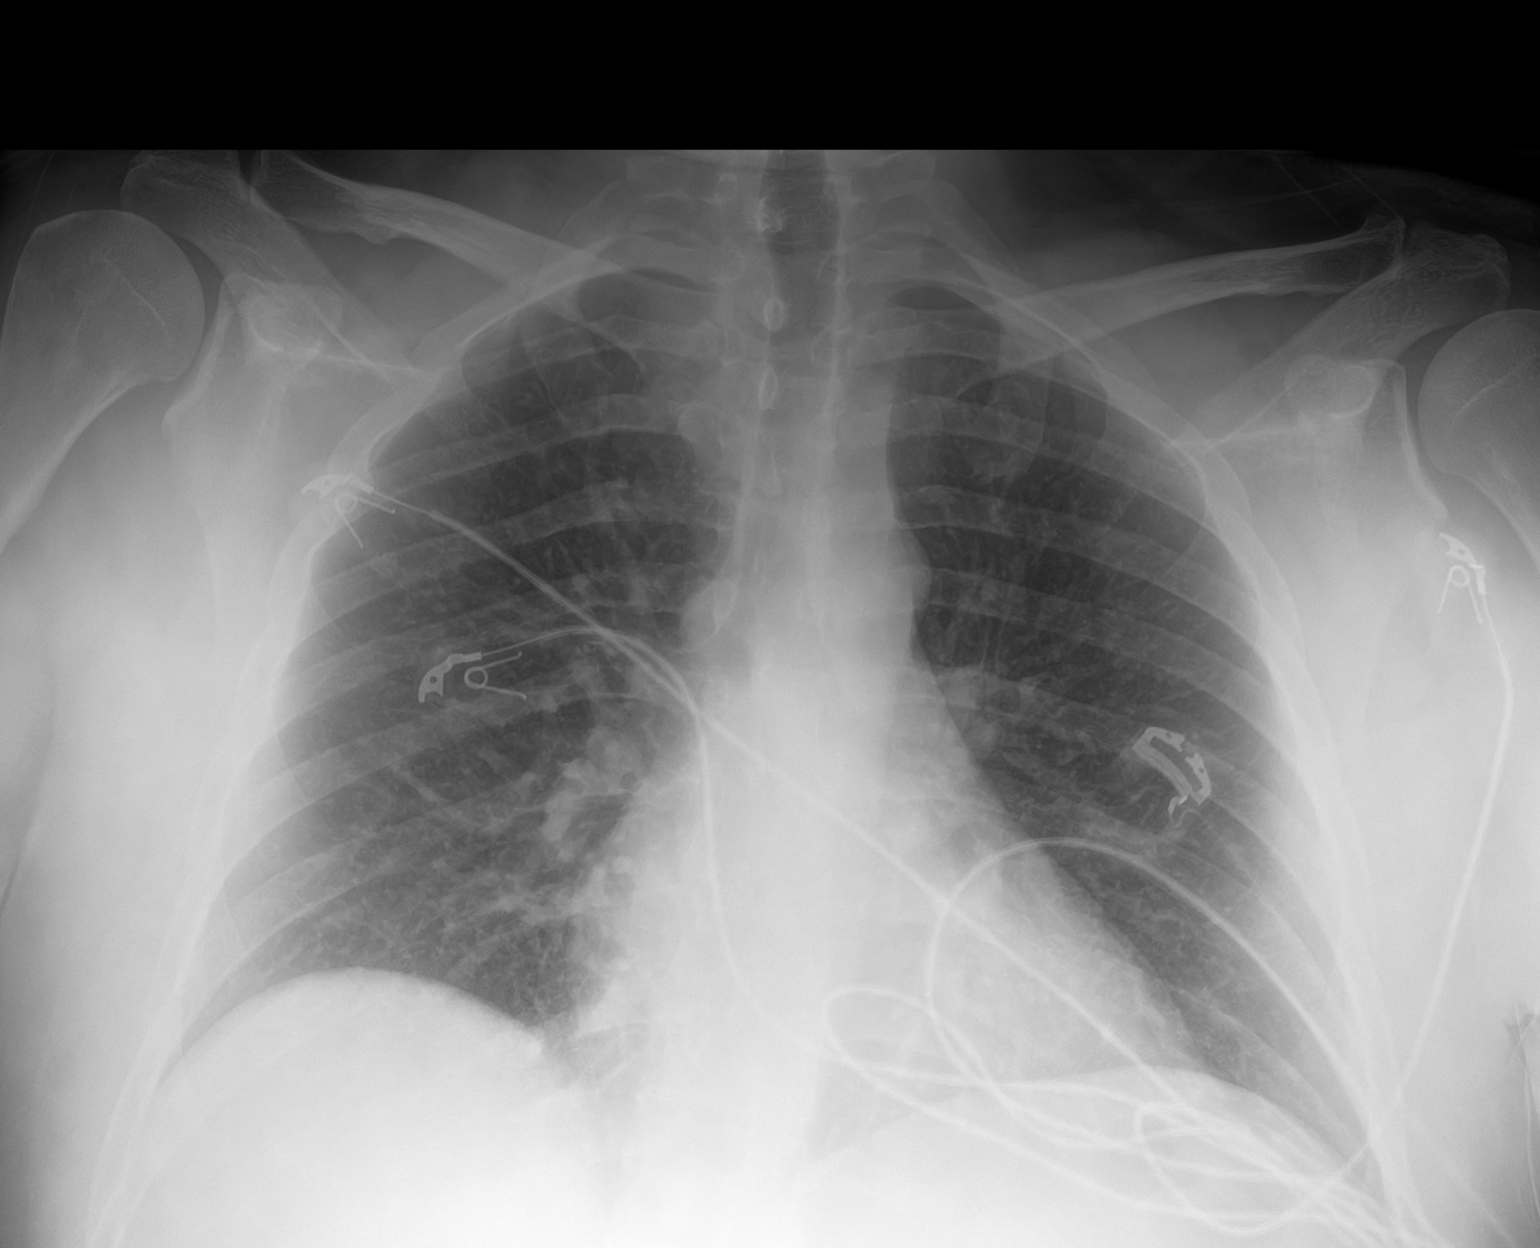

[1 of 1 positions shown; findings below may reference images not displayed]

FINDINGS: The heart size and mediastinal contours are within normal limits.
Both lungs are clear. The visualized skeletal structures are
unremarkable.
IMPRESSION: No active disease.

## 2021-04-16 IMAGING — CT CT ANGIO CHEST-ABD-PELV FOR DISSECTION W/ AND WO/W CM
2 of 7 series · 13 of 46 positions shown, 15 images · IV contrast (Omnipaque or Isovue)
Comparison: Portable chest 0600 hours today. Abdominal radiograph
11/02/2004.

CLINICAL DATA: 24-year-old male with 2 days of sharp chest pain.
Anxiety.

EXAM:
CT ANGIOGRAPHY CHEST, ABDOMEN AND PELVIS
TECHNIQUE: Non-contrast CT of the chest was initially obtained.

[Series 6: axial arterial · axial · arterial · 0.75mm/px · z∈[+487,+1120]mm · 10 of 243 slices shown, 12 images]
[im 16/243  soft-tissue]
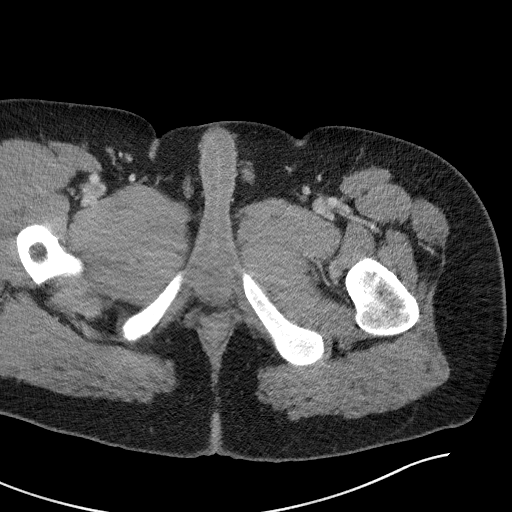
[im 16/243  bone]
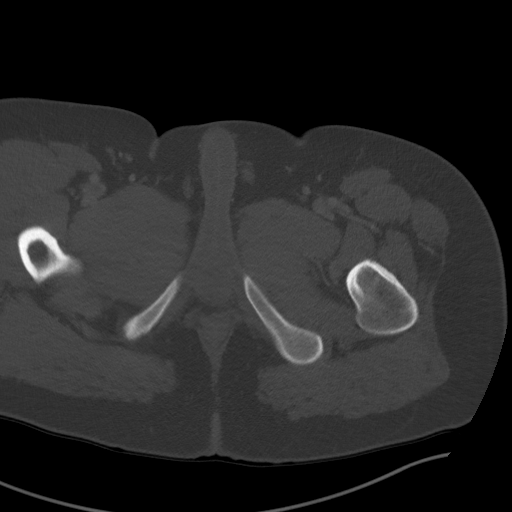
[im 46/243  soft-tissue]
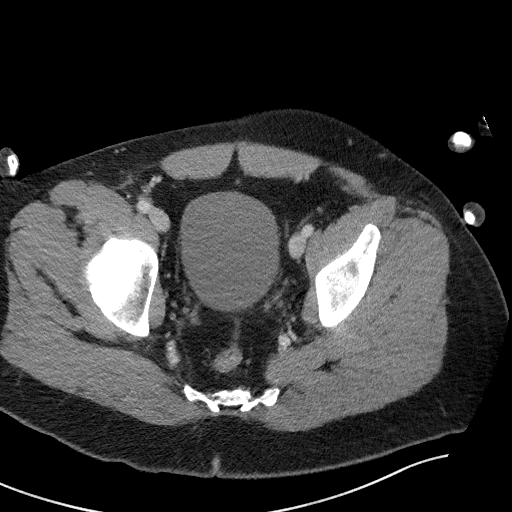
[im 61/243  soft-tissue]
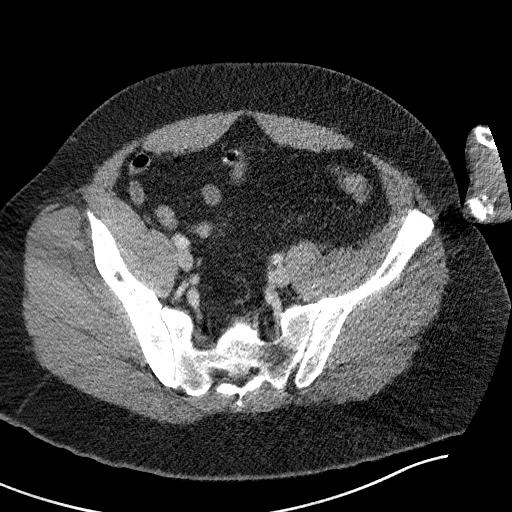
[im 91/243  soft-tissue]
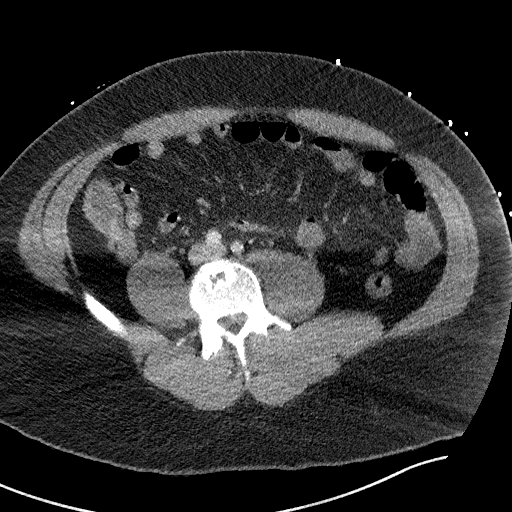
[im 106/243  soft-tissue]
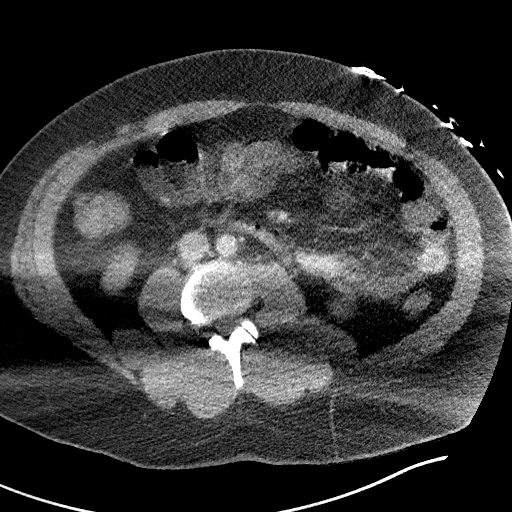
[im 137/243  soft-tissue]
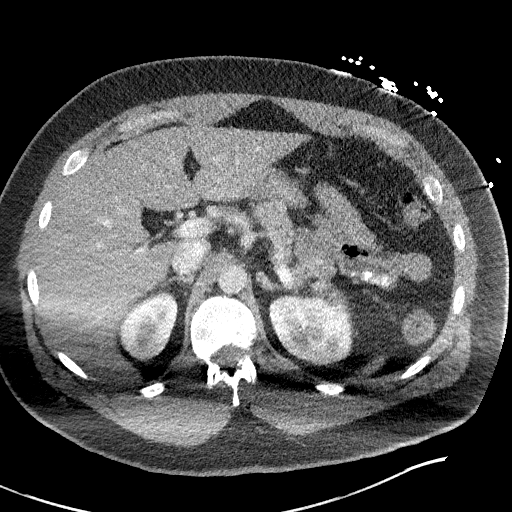
[im 152/243  soft-tissue]
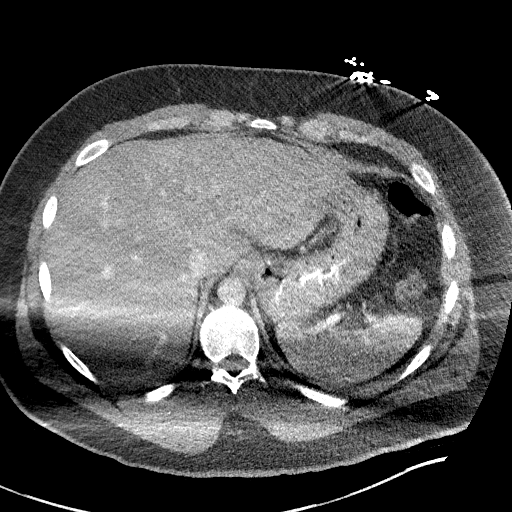
[im 182/243  soft-tissue]
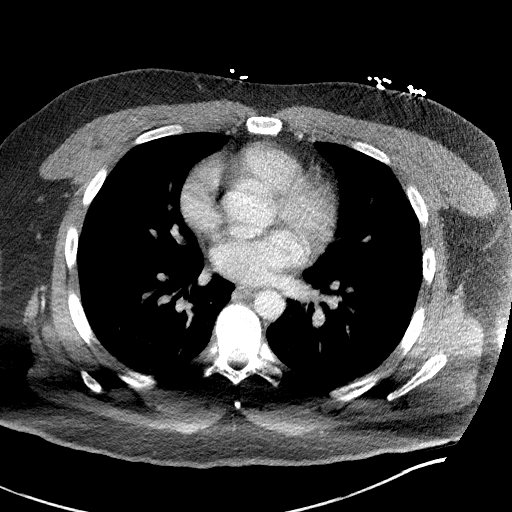
[im 197/243  soft-tissue]
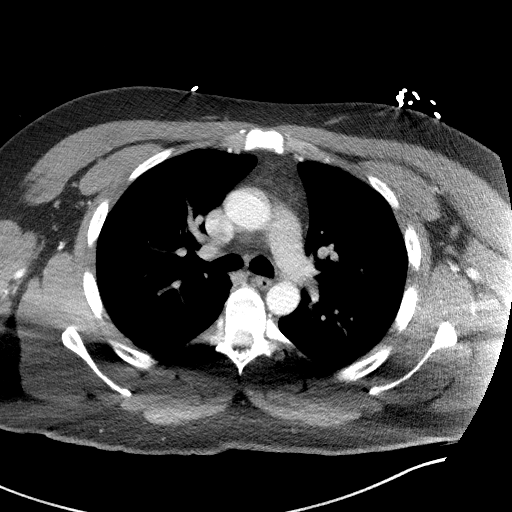
[im 197/243  bone]
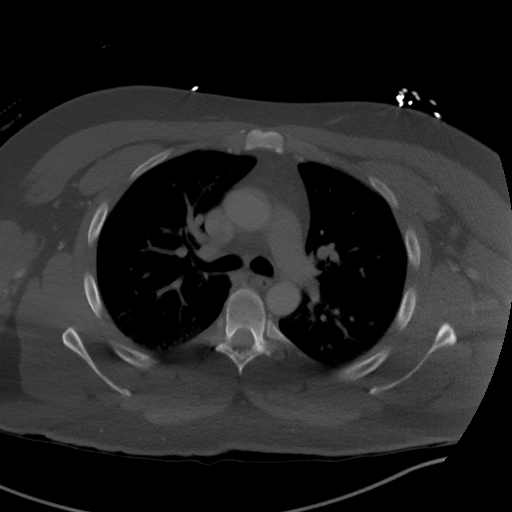
[im 227/243  soft-tissue]
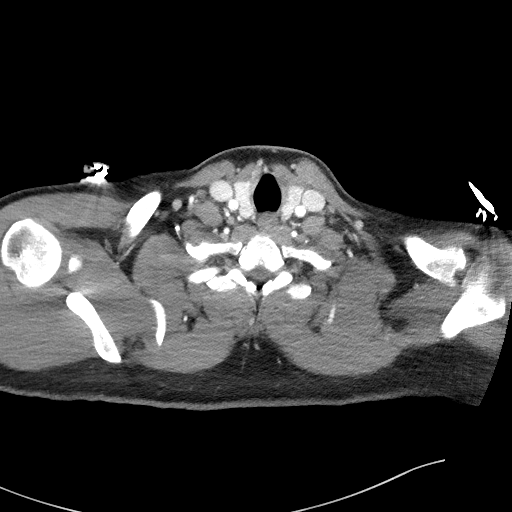

[Series 10: cor soft · coronal · 0.75mm/px · 3 of 146 slices shown]
[im 37/146  soft-tissue]
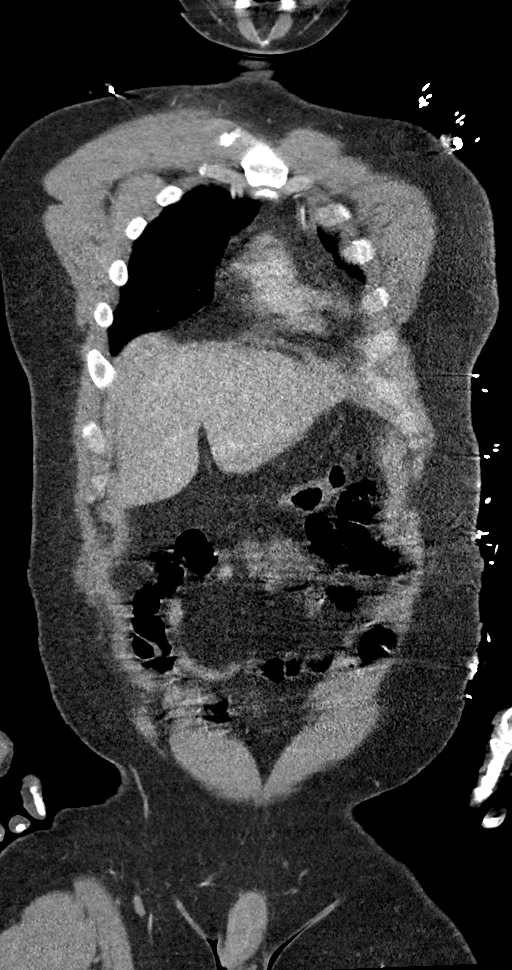
[im 73/146  soft-tissue]
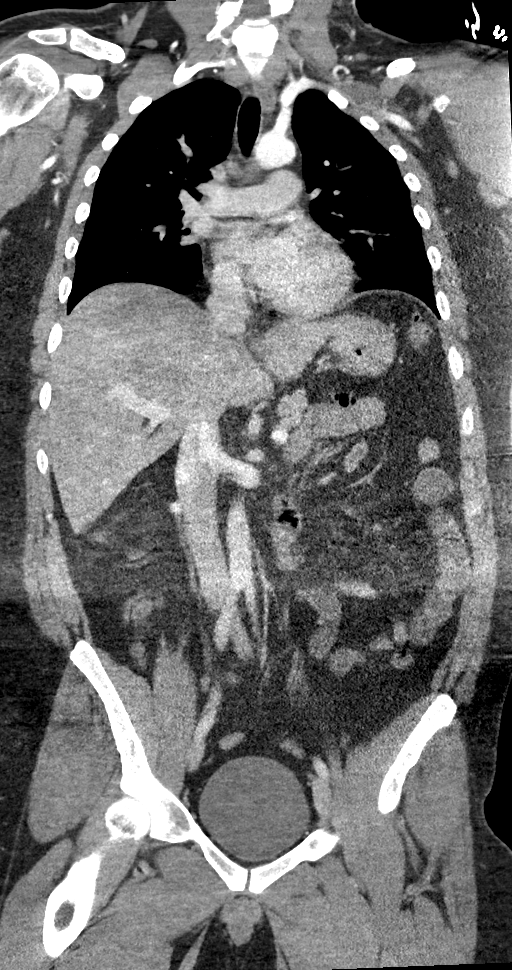
[im 109/146  soft-tissue]
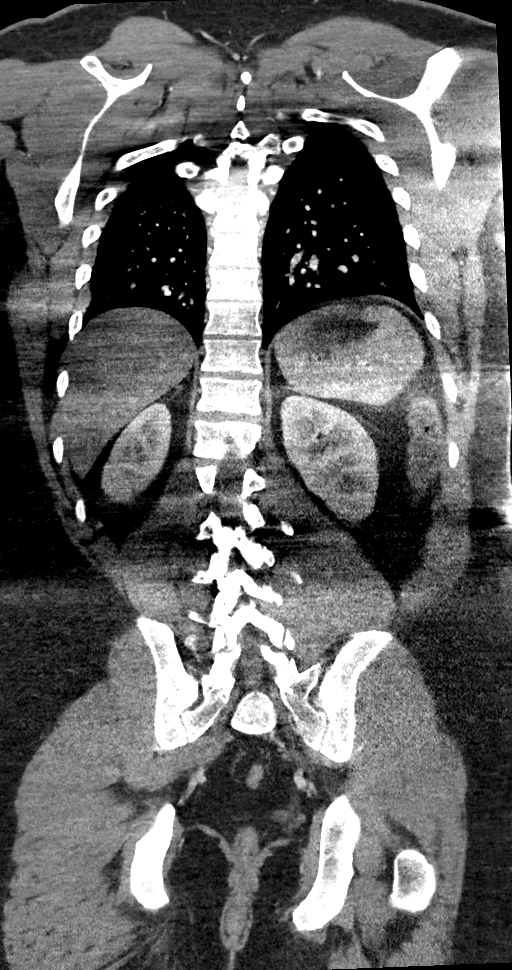

[13 of 46 positions shown; findings below may reference images not displayed]

Multidetector CT imaging through the chest, abdomen and pelvis was
performed using the standard protocol during bolus administration of
intravenous contrast. Multiplanar reconstructed images and MIPs were
obtained and reviewed to evaluate the vascular anatomy.

CONTRAST:  100mL OMNIPAQUE IOHEXOL 350 MG/ML SOLN
FINDINGS: CTA CHEST FINDINGS

Cardiovascular: No calcified atherosclerosis in the chest. Normal
thoracic aorta. No cardiomegaly or pericardial effusion. Late
contrast timing in the pulmonary arteries, but the central PA
appears to be patent.

Mediastinum/Nodes: Negative. No mediastinal hematoma or
lymphadenopathy.

Lungs/Pleura: Mildly low lung volumes. Major airways are patent.
Minor dependent atelectasis in both lungs, but otherwise the lungs
are clear. No pleural effusion.

Musculoskeletal: No acute osseous abnormality identified. Minimal
thoracic scoliosis.

Review of the MIP images confirms the above findings.

CTA ABDOMEN AND PELVIS FINDINGS

VASCULAR

Normal caliber abdominal aorta which is patent along with its major
branches. Patent and normal iliofemoral arteries. No
atherosclerosis, dissection or aneurysm identified.

Review of the MIP images confirms the above findings.

NON-VASCULAR

Hepatobiliary: Streak artifact related to the upper extremities.
Hepatic steatosis suspected, but otherwise negative liver and
gallbladder. No bile duct enlargement.

Pancreas: Negative.

Spleen: Streak artifact, negative.

Adrenals/Urinary Tract: Normal adrenal glands. Kidneys enhance
symmetrically and appear normal. Motion artifact at both renal lower
poles. No hydronephrosis or hydroureter. Pelvic phleboliths.
Negative urinary bladder.

Stomach/Bowel: Intermittent mild motion in the abdomen. No large
bowel inflammation, and evidence of normal retrocecal appendix on
series 6, image 154. No dilated small bowel. Small volume of oral
contrast in the stomach which along with the duodenum is fairly
decompressed. No free air or free fluid identified. No mesenteric
stranding.

Lymphatic: No lymphadenopathy.

Reproductive: Negative.

Other: No pelvic free fluid.

Musculoskeletal: Dextroconvex scoliosis. Chronic L4 superior
endplate Schmorl's node (degenerative). Scattered small benign bone
islands suspected in the pelvis. No acute osseous abnormality
identified.

Review of the MIP images confirms the above findings.
IMPRESSION: 1. Negative CTA; Normal aorta. No evidence of atherosclerosis,
dissection, or aneurysm.
2. Mild pulmonary atelectasis.
3. Hepatic steatosis.
4. Dextroconvex scoliosis.

## 2021-11-28 ENCOUNTER — Encounter (HOSPITAL_BASED_OUTPATIENT_CLINIC_OR_DEPARTMENT_OTHER): Payer: Self-pay | Admitting: Urology

## 2021-11-28 ENCOUNTER — Other Ambulatory Visit: Payer: Self-pay

## 2021-11-28 ENCOUNTER — Emergency Department (HOSPITAL_BASED_OUTPATIENT_CLINIC_OR_DEPARTMENT_OTHER): Payer: Self-pay

## 2021-11-28 ENCOUNTER — Emergency Department (HOSPITAL_BASED_OUTPATIENT_CLINIC_OR_DEPARTMENT_OTHER)
Admission: EM | Admit: 2021-11-28 | Discharge: 2021-11-29 | Disposition: A | Discharge status: Home / Self Care | Payer: Self-pay | Attending: Emergency Medicine | Admitting: Emergency Medicine

## 2021-11-28 DIAGNOSIS — F419 Anxiety disorder, unspecified: Secondary | ICD-10-CM

## 2021-11-28 DIAGNOSIS — K219 Gastro-esophageal reflux disease without esophagitis: Secondary | ICD-10-CM | POA: Insufficient documentation

## 2021-11-28 DIAGNOSIS — E876 Hypokalemia: Secondary | ICD-10-CM | POA: Insufficient documentation

## 2021-11-28 DIAGNOSIS — R1013 Epigastric pain: Secondary | ICD-10-CM

## 2021-11-28 LAB — COMPREHENSIVE METABOLIC PANEL
ALT: 68 U/L — ABNORMAL HIGH (ref 0–44)
AST: 39 U/L (ref 15–41)
Albumin: 4.7 g/dL (ref 3.5–5.0)
Alkaline Phosphatase: 61 U/L (ref 38–126)
Anion gap: 11 (ref 5–15)
BUN: 10 mg/dL (ref 6–20)
CO2: 23 mmol/L (ref 22–32)
Calcium: 9.7 mg/dL (ref 8.9–10.3)
Chloride: 103 mmol/L (ref 98–111)
Creatinine, Ser: 1.14 mg/dL (ref 0.61–1.24)
GFR, Estimated: >60 mL/min (ref >60)
Glucose, Bld: 104 mg/dL — ABNORMAL HIGH (ref 70–99)
Potassium: 3.2 mmol/L — ABNORMAL LOW (ref 3.5–5.1)
Sodium: 137 mmol/L (ref 135–145)
Total Bilirubin: 0.8 mg/dL (ref 0.3–1.2)
Total Protein: 8.1 g/dL (ref 6.5–8.1)

## 2021-11-28 LAB — LIPASE, BLOOD: Lipase: 33 U/L (ref 11–51)

## 2021-11-28 LAB — CBC
HCT: 45.7 % (ref 39.0–52.0)
Hemoglobin: 15.8 g/dL (ref 13.0–17.0)
MCH: 28.7 pg (ref 26.0–34.0)
MCHC: 34.6 g/dL (ref 30.0–36.0)
MCV: 82.9 fL (ref 80.0–100.0)
Platelets: 271 10*3/uL (ref 150–400)
RBC: 5.51 MIL/uL (ref 4.22–5.81)
RDW: 12.2 % (ref 11.5–15.5)
WBC: 9.7 10*3/uL (ref 4.0–10.5)
nRBC: 0 % (ref 0.0–0.2)

## 2021-11-28 MED ORDER — POTASSIUM CHLORIDE CRYS ER 20 MEQ PO TBCR: 20.0000 meq | EXTENDED_RELEASE_TABLET | Freq: Two times a day (BID) | ORAL | 0 refills | Status: DC

## 2021-11-28 MED ORDER — SODIUM CHLORIDE 0.9 % IV BOLUS
1000.0000 mL | Freq: Once | INTRAVENOUS | Status: AC
  Administered 2021-11-28: 1000 mL via INTRAVENOUS

## 2021-11-28 MED ORDER — ONDANSETRON HCL 4 MG/2ML IJ SOLN
4.0000 mg | Freq: Once | INTRAMUSCULAR | Status: AC
Start: 2021-11-28 — End: 2021-11-28
  Administered 2021-11-28: 4 mg via INTRAVENOUS
  Filled 2021-11-28: qty 2

## 2021-11-28 MED ORDER — HYDROXYZINE HCL 25 MG PO TABS: 25.0000 mg | ORAL_TABLET | Freq: Four times a day (QID) | ORAL | 0 refills | Status: DC

## 2021-11-28 MED ORDER — FAMOTIDINE 20 MG PO TABS: 20.0000 mg | ORAL_TABLET | Freq: Two times a day (BID) | ORAL | 0 refills | Status: DC

## 2021-11-28 MED ORDER — FAMOTIDINE IN NACL 20-0.9 MG/50ML-% IV SOLN
20.0000 mg | Freq: Once | INTRAVENOUS | Status: AC
Start: 2021-11-28 — End: 2021-11-28
  Administered 2021-11-28: 20 mg via INTRAVENOUS
  Filled 2021-11-28: qty 50

## 2021-11-28 NOTE — Discharge Instructions (Addendum)
You were seen in the emergency department today for abdominal pain.  As we discussed your lab work was all reassuring.  I think that your symptoms are likely related to reflux and anxiety.  I am prescribing you a medicine called Pepcid, which works similarly to omeprazole.  I want you to take this twice daily.  I am also prescribing a medicine called hydroxyzine for anxiety.  You can take 1 tablet by mouth every 6 hours as needed.  This medication can make you sleepy, so I recommend taking it before you go to bed tonight to see how makes you feel. Do not drink alcohol or drive while taking this medication.   Lastly, as we discussed your potassium was slightly low.  I am prescribing you potassium replacement.  I'd like you to take 1 tablet twice daily for the next 3 days.

## 2021-11-28 NOTE — ED Triage Notes (Signed)
States Epigastric pain with heartburn x 2 months, worsening today, take 80mg  omeprazole daily and not helping today  State HTN at home x 2 days NAD at this time A&O x 4

## 2021-11-28 NOTE — ED Notes (Signed)
ED Provider at bedside. 

## 2021-11-28 NOTE — ED Provider Notes (Signed)
[facility] EMERGENCY DEPARTMENT Provider Note   CSN: [id number] Arrival date & time: 11/28/21  2229     History  Chief Complaint  Patient presents with   Abdominal Pain    Kevin Wyatt is a 26 y.o. male who presents emergency department complaining of epigastric pain with heartburn for 2 months, worsening today.  Patient tried 80 mg of omeprazole, but this did not help today.  He drank alcohol last night which he thinks may have made his symptoms worse.  No fevers, chills.  No vomiting or diarrhea.   Abdominal Pain Associated symptoms: no chest pain, no chills, no constipation, no cough, no diarrhea, no dysuria, no fever, no nausea, no shortness of breath and no vomiting       Home Medications Prior to Admission medications   Medication Sig Start Date End Date Taking? Authorizing Provider  famotidine (PEPCID) 20 MG tablet Take 1 tablet (20 mg total) by mouth 2 (two) times daily. 11/28/21  Yes Ardene Remley T, PA-C  hydrOXYzine (ATARAX) 25 MG tablet Take 1 tablet (25 mg total) by mouth every 6 (six) hours. 11/28/21  Yes Jaquay Posthumus T, PA-C  potassium chloride SA (KLOR-CON M) 20 MEQ tablet Take 1 tablet (20 mEq total) by mouth 2 (two) times daily for 3 days. 11/28/21 12/01/21 Yes Hayden Kihara T, PA-C  diazepam (VALIUM) 5 MG tablet Take 5 mg by mouth 2 (two) times daily as needed. 04/24/20   [provider]  omeprazole (PRILOSEC) 40 MG capsule Take 40 mg by mouth 2 (two) times daily. 06/30/20   [provider]  Omeprazole-Sodium Bicarbonate (ZEGERID) 20-1100 MG CAPS capsule Take 1 capsule by mouth 2 (two) times daily before a meal. Patient not taking: Reported on 07/26/2020 09/21/16 07/27/20  Gelene Mink, NP      Allergies    Patient has no known allergies.    Review of Systems   Review of Systems  Constitutional:  Negative for chills and fever.  Respiratory:  Negative for cough and shortness of breath.   Cardiovascular:  Negative for chest  pain.  Gastrointestinal:  Positive for abdominal pain. Negative for constipation, diarrhea, nausea and vomiting.  Genitourinary:  Negative for dysuria, frequency and urgency.  All other systems reviewed and are negative.  Physical Exam Updated Vital Signs BP 113/78    Pulse 79    Temp 98 F (36.7 C) (Oral)    Resp 19    Ht 5\' 11"  (1.803 m)    Wt 120.2 kg    SpO2 100%    BMI 36.96 kg/m  Physical Exam Vitals and nursing note reviewed.  Constitutional:      Appearance: Normal appearance.  HENT:     Head: Normocephalic and atraumatic.  Eyes:     Conjunctiva/sclera: Conjunctivae normal.  Cardiovascular:     Rate and Rhythm: Normal rate and regular rhythm.  Pulmonary:     Effort: Pulmonary effort is normal. No respiratory distress.     Breath sounds: Normal breath sounds.  Abdominal:     General: There is no distension.     Palpations: Abdomen is soft.     Tenderness: There is abdominal tenderness in the epigastric area. There is no guarding or rebound.  Skin:    General: Skin is warm and dry.  Neurological:     General: No focal deficit present.     Mental Status: He is alert.    ED Results / Procedures / Treatments   Labs (all labs  ordered are listed, but only abnormal results are displayed) Labs Reviewed  COMPREHENSIVE METABOLIC PANEL - Abnormal; Notable for the following components:      Result Value   Potassium 3.2 (*)    Glucose, Bld 104 (*)    ALT 68 (*)    All other components within normal limits  LIPASE, BLOOD  CBC  URINALYSIS, ROUTINE W REFLEX MICROSCOPIC    EKG EKG Interpretation  Date/Time:  Sunday November 28 2021 22:39:03 EST Ventricular Rate:  94 PR Interval:  167 QRS Duration: 107 QT Interval:  355 QTC Calculation: 444 R Axis:   53 Text Interpretation: Sinus rhythm Confirmed by Lennice Sites ([id number]) on 11/28/2021 10:50:06 PM  Radiology No results found.  Procedures Procedures    Medications Ordered in ED Medications  sodium chloride 0.9 %  bolus 1,000 mL (1,000 mLs Intravenous New Bag/Given 11/28/21 2258)  ondansetron (ZOFRAN) injection 4 mg (4 mg Intravenous Given 11/28/21 2256)  famotidine (PEPCID) IVPB 20 mg premix (0 mg Intravenous Stopped 11/28/21 2331)    ED Course/ Medical Decision Making/ A&P                           Medical Decision Making Amount and/or Complexity of Data Reviewed Labs: ordered.   This patient presents to the ED for concern of abdominal pain, this involves an extensive number of treatment options, and is a complaint that carries with it a high risk of complications and morbidity. The emergent differential diagnosis prior to evaluation includes, but is not limited to,  AAA, mesenteric ischemia, appendicitis, diverticulitis, DKA, gastritis, gastroenteritis, nephrolithiasis, pancreatitis, peritonitis, adrenal insufficiency, intestinal ischemia, constipation, UTI,SBO/LBO, splenic rupture, biliary disease, IBD, IBS, PUD, or hepatitis.  Past Medical History / Co-morbidities: GERD, vasovagal syncope, anxiety, methamphetamine abuse, alcohol induced mood disorder  Physical Exam: Physical exam performed. The pertinent findings include: Patient appeared to have vasovagal near syncope after IV initiation.  Patient had elevated heart rate, diaphoresis, and nausea.  Abdomen is soft, nontender nondistended.  Epigastric tenderness to palpation without guarding or rebound.  Lab Tests: I ordered, and personally interpreted labs.  The pertinent results include: No leukocytosis, hemoglobin normal, mild hypokalemia of 3.2, otherwise electrolytes grossly within normal limits, normal lipase.   Cardiac Monitoring:  The patient was maintained on a cardiac monitor.  My attending physician Dr. Ronnald Nian viewed and interpreted the cardiac monitored which showed an underlying rhythm of: normal sinus rhythm. I agree with his interpretation.    Medications: I ordered medication including IV fluids, Zofran, famotidine for abdominal  pain. Reevaluation of the patient after these medicines showed that the patient improved. I have reviewed the patients home medicines and have made adjustments as needed.  Disposition: After consideration of the diagnostic results and the patients response to treatment, I feel that patient is not requiring admission or inpatient treatment for his symptoms.  Repeat abdominal exam remains nonsurgical.  The further that we discussed, I believe that patient's symptoms are likely related to GERD as well as anxiety.  Patient states that he was feeling very anxious about the pain he was having was having concerns that he was having a heart attack.  He has been observed in the emergency department with no worsening of his condition, and normal vital signs.  I have low concern for other acute pathology today.  Will discharge to home with prescription for Pepcid, hydroxyzine, and potassium replacement.  We discussed reasons to return to the emergency department, and patient  is agreeable to the plan.  Final Clinical Impression(s) / ED Diagnoses Final diagnoses:  Epigastric pain  Gastroesophageal reflux disease without esophagitis  Anxiety    Rx / DC Orders ED Discharge Orders          Ordered    potassium chloride SA (KLOR-CON M) 20 MEQ tablet  2 times daily        11/28/21 2350    hydrOXYzine (ATARAX) 25 MG tablet  Every 6 hours        11/28/21 2350    famotidine (PEPCID) 20 MG tablet  2 times daily        11/28/21 2350           Portions of this report may have been transcribed using voice recognition software. Every effort was made to ensure accuracy; however, inadvertent computerized transcription errors may be present.    Estill Cotta 11/28/21 2352    Molpus, John, MD 11/29/21 [phone]

## 2021-12-13 ENCOUNTER — Encounter (HOSPITAL_BASED_OUTPATIENT_CLINIC_OR_DEPARTMENT_OTHER): Payer: Self-pay

## 2021-12-13 ENCOUNTER — Emergency Department (HOSPITAL_BASED_OUTPATIENT_CLINIC_OR_DEPARTMENT_OTHER)
Admission: EM | Admit: 2021-12-13 | Discharge: 2021-12-13 | Disposition: A | Discharge status: Home / Self Care | Payer: Medicaid Other | Attending: Emergency Medicine | Admitting: Emergency Medicine

## 2021-12-13 ENCOUNTER — Other Ambulatory Visit: Payer: Self-pay

## 2021-12-13 DIAGNOSIS — K219 Gastro-esophageal reflux disease without esophagitis: Secondary | ICD-10-CM | POA: Insufficient documentation

## 2021-12-13 LAB — COMPREHENSIVE METABOLIC PANEL
ALT: 46 U/L — ABNORMAL HIGH (ref 0–44)
AST: 22 U/L (ref 15–41)
Albumin: 4.5 g/dL (ref 3.5–5.0)
Alkaline Phosphatase: 63 U/L (ref 38–126)
Anion gap: 9 (ref 5–15)
BUN: 13 mg/dL (ref 6–20)
CO2: 27 mmol/L (ref 22–32)
Calcium: 9.8 mg/dL (ref 8.9–10.3)
Chloride: 102 mmol/L (ref 98–111)
Creatinine, Ser: 1.06 mg/dL (ref 0.61–1.24)
GFR, Estimated: >60 mL/min (ref >60)
Glucose, Bld: 89 mg/dL (ref 70–99)
Potassium: 3.8 mmol/L (ref 3.5–5.1)
Sodium: 138 mmol/L (ref 135–145)
Total Bilirubin: 0.7 mg/dL (ref 0.3–1.2)
Total Protein: 8.2 g/dL — ABNORMAL HIGH (ref 6.5–8.1)

## 2021-12-13 LAB — CBC
HCT: 44.8 % (ref 39.0–52.0)
Hemoglobin: 15.4 g/dL (ref 13.0–17.0)
MCH: 28.9 pg (ref 26.0–34.0)
MCHC: 34.4 g/dL (ref 30.0–36.0)
MCV: 84.2 fL (ref 80.0–100.0)
Platelets: 315 10*3/uL (ref 150–400)
RBC: 5.32 MIL/uL (ref 4.22–5.81)
RDW: 12.3 % (ref 11.5–15.5)
WBC: 11.2 10*3/uL — ABNORMAL HIGH (ref 4.0–10.5)
nRBC: 0 % (ref 0.0–0.2)

## 2021-12-13 LAB — LIPASE, BLOOD: Lipase: 39 U/L (ref 11–51)

## 2021-12-13 LAB — URINALYSIS, ROUTINE W REFLEX MICROSCOPIC
Bilirubin Urine: NEGATIVE
Glucose, UA: NEGATIVE mg/dL
Hgb urine dipstick: NEGATIVE
Ketones, ur: NEGATIVE mg/dL
Leukocytes,Ua: NEGATIVE
Nitrite: NEGATIVE
Protein, ur: NEGATIVE mg/dL
Specific Gravity, Urine: 1.025 (ref 1.005–1.030)
pH: 7 (ref 5.0–8.0)

## 2021-12-13 MED ORDER — LIDOCAINE VISCOUS HCL 2 % MT SOLN
15.0000 mL | Freq: Once | OROMUCOSAL | Status: AC
  Administered 2021-12-13: 15 mL via ORAL
  Filled 2021-12-13: qty 15

## 2021-12-13 MED ORDER — ALUM & MAG HYDROXIDE-SIMETH 200-200-20 MG/5ML PO SUSP
30.0000 mL | Freq: Once | ORAL | Status: AC
  Administered 2021-12-13: 30 mL via ORAL
  Filled 2021-12-13: qty 30

## 2021-12-13 NOTE — ED Triage Notes (Signed)
Pt c/o left side and pain n/d x today-NAD-steady gait-pt was seated in ED WR eating a snack/drinking soda-was asked to stop eating/drinking until seen by EDP/PA

## 2021-12-13 NOTE — Discharge Instructions (Signed)
Recommend continuing the previously prescribed Pepcid.  Recommend following up with gastroenterology specialist and a primary care doctor.  If you develop worsening pain, vomiting, or other new concerning symptoms, come back to ER for reassessment.  Strongly advise avoiding fatty, greasy foods, spicy foods.  Strongly recommend bland, boring diet.

## 2021-12-13 NOTE — ED Provider Notes (Signed)
[facility] Provider Note   CSN: [id number] Arrival date & time: 12/13/21  1956     History  Chief Complaint  Patient presents with   Abdominal Pain    Kevin Wyatt is a 26 y.o. male.  Presents to ER with concern for abdominal pain.  States that he has been having intermittent episodes of pain at his upper, left upper abdomen over the past few weeks.  States that he has struggled with gastric reflux disease previously.  Used to be on a PPI but was recently started on Pepcid by ER provider.  Does not have a primary care doctor, has not seen gastroenterology.  Had bad episode yesterday after eating greasy food.  Today had some discomfort after eating a hamburger.  Currently does not have any ongoing pain.  No associated chest pain or difficulty in breathing.  Denies pain on the right side of his abdomen or lower abdomen.  No fevers or chills.  No blood in stool or blood in vomit.  HPI     Home Medications Prior to Admission medications   Medication Sig Start Date End Date Taking? Authorizing Provider  diazepam (VALIUM) 5 MG tablet Take 5 mg by mouth 2 (two) times daily as needed. 04/24/20   [provider]  famotidine (PEPCID) 20 MG tablet Take 1 tablet (20 mg total) by mouth 2 (two) times daily. 11/28/21   Roemhildt, Lorin T, PA-C  hydrOXYzine (ATARAX) 25 MG tablet Take 1 tablet (25 mg total) by mouth every 6 (six) hours. 11/28/21   Roemhildt, Lorin T, PA-C  omeprazole (PRILOSEC) 40 MG capsule Take 40 mg by mouth 2 (two) times daily. 06/30/20   [provider]  potassium chloride SA (KLOR-CON M) 20 MEQ tablet Take 1 tablet (20 mEq total) by mouth 2 (two) times daily for 3 days. 11/28/21 12/01/21  Roemhildt, Lorin T, PA-C  Omeprazole-Sodium Bicarbonate (ZEGERID) 20-1100 MG CAPS capsule Take 1 capsule by mouth 2 (two) times daily before a meal. Patient not taking: Reported on 07/26/2020 09/21/16 07/27/20  Annitta Needs, NP      Allergies     Patient has no known allergies.    Review of Systems   Review of Systems  Constitutional:  Positive for fatigue. Negative for chills and fever.  HENT:  Negative for ear pain and sore throat.   Eyes:  Negative for pain and visual disturbance.  Respiratory:  Negative for cough and shortness of breath.   Cardiovascular:  Negative for chest pain and palpitations.  Gastrointestinal:  Positive for abdominal pain and nausea. Negative for vomiting.  Genitourinary:  Negative for dysuria and hematuria.  Musculoskeletal:  Negative for arthralgias and back pain.  Skin:  Negative for color change and rash.  Neurological:  Negative for seizures and syncope.  All other systems reviewed and are negative.  Physical Exam Updated Vital Signs BP 132/82 (BP Location: Left Arm)    Pulse 82    Temp 98.4 F (36.9 C) (Oral)    Resp 18    SpO2 99%  Physical Exam Vitals and nursing note reviewed.  Constitutional:      General: He is not in acute distress.    Appearance: He is well-developed.  HENT:     Head: Normocephalic and atraumatic.  Eyes:     Conjunctiva/sclera: Conjunctivae normal.  Cardiovascular:     Rate and Rhythm: Normal rate and regular rhythm.     Heart sounds: No murmur heard. Pulmonary:  Effort: Pulmonary effort is normal. No respiratory distress.     Breath sounds: Normal breath sounds.  Abdominal:     Palpations: Abdomen is soft.     Tenderness: There is abdominal tenderness in the left upper quadrant. There is no guarding or rebound. Negative signs include Murphy's sign and McBurney's sign.  Musculoskeletal:        General: No swelling.     Cervical back: Neck supple.  Skin:    General: Skin is warm and dry.     Capillary Refill: Capillary refill takes less than 2 seconds.  Neurological:     Mental Status: He is alert.  Psychiatric:        Mood and Affect: Mood normal.    ED Results / Procedures / Treatments   Labs (all labs ordered are listed, but only abnormal  results are displayed) Labs Reviewed  COMPREHENSIVE METABOLIC PANEL - Abnormal; Notable for the following components:      Result Value   Total Protein 8.2 (*)    ALT 46 (*)    All other components within normal limits  CBC - Abnormal; Notable for the following components:   WBC 11.2 (*)    All other components within normal limits  LIPASE, BLOOD  URINALYSIS, ROUTINE W REFLEX MICROSCOPIC    EKG None  Radiology No results found.  Procedures Procedures    Medications Ordered in ED Medications  alum & mag hydroxide-simeth (MAALOX/MYLANTA) 200-200-20 MG/5ML suspension 30 mL (30 mLs Oral Given 12/13/21 2249)    And  lidocaine (XYLOCAINE) 2 % viscous mouth solution 15 mL (15 mLs Oral Given 12/13/21 2249)    ED Course/ Medical Decision Making/ A&P                           Medical Decision Making Amount and/or Complexity of Data Reviewed Labs: ordered.  Risk OTC drugs. Prescription drug management.   26 year old male presented to ER with concern for left upper abdominal pain.  Pain associated mostly after mealtime.  On exam he is well-appearing in no distress, vitals are normal, no fever or tachycardia.  Noted mild tenderness in his left upper quadrant but no rebound or guarding.  No change in LFTs, no leukocytosis, no electrolyte derangement.  Based on his reassuring work-up and his benign abdominal exam and his history, suspect most likely gastritis, gastric reflux disease.  Given duration of symptoms, feel would be reasonable for patient to follow-up with a gastroenterologist.  Stressed importance of having a primary care doctor as well.  No ongoing pain at present, tolerating p.o.  Feel he is appropriate for discharge and outpatient management.  After the discussed management above, the patient was determined to be safe for discharge.  The patient was in agreement with this plan and all questions regarding their care were answered.  ED return precautions were discussed and the  patient will return to the ED with any significant worsening of condition.         Final Clinical Impression(s) / ED Diagnoses Final diagnoses:  Gastroesophageal reflux disease, unspecified whether esophagitis present    Rx / DC Orders ED Discharge Orders     None         Lucrezia Starch, MD 12/14/21 1513

## 2022-03-02 ENCOUNTER — Telehealth: Payer: Self-pay

## 2022-03-02 NOTE — Telephone Encounter (Signed)
Called [facility] client and was able to assist in setting up an appointment with [facility] to establish primary medical care. Appointment scheduled for 03/09/22 at 10:30 am. ?Client instructed if he is currently taking any medications to please bring those to his appointment. Client states he has only been taking OTC omeprazole and has not had a prescription for it in a couple years when he lost insurance. ?Discussed if he is unable to attend his appointment to please call [facility] at least 24 hours in advance to reschedule.  ?With Client's permission will text him the date and time of appointment along with contact information for the clinic. ? ?Plan: will call to follow up with client after his intial visit/ client agreeable. ? ?Debria Garret RN ?[facility] [employer] ?

## 2022-03-02 NOTE — Telephone Encounter (Signed)
Attempted to call client who recently enrolled in [facility] on 03/01/22 to assist in making an appointment with [facility] in [place] to establish primary medical care.  ?No answer, left voicemail requesting return call. ? ? ?Francee Nodal RN ?[facility] [employer] ?

## 2022-03-09 ENCOUNTER — Encounter: Payer: Self-pay | Admitting: Physician Assistant

## 2022-03-09 ENCOUNTER — Ambulatory Visit: Payer: 59 | Admitting: Physician Assistant

## 2022-03-09 VITALS — BP 126/90 | HR 89 | Temp 97.7°F | Ht 71.0 in | Wt 267.0 lb

## 2022-03-09 DIAGNOSIS — F32A Depression, unspecified: Secondary | ICD-10-CM

## 2022-03-09 DIAGNOSIS — R7989 Other specified abnormal findings of blood chemistry: Secondary | ICD-10-CM

## 2022-03-09 DIAGNOSIS — Z7289 Other problems related to lifestyle: Secondary | ICD-10-CM

## 2022-03-09 DIAGNOSIS — K219 Gastro-esophageal reflux disease without esophagitis: Secondary | ICD-10-CM

## 2022-03-09 DIAGNOSIS — E669 Obesity, unspecified: Secondary | ICD-10-CM

## 2022-03-09 DIAGNOSIS — Z13220 Encounter for screening for lipoid disorders: Secondary | ICD-10-CM

## 2022-03-09 DIAGNOSIS — Z7689 Persons encountering health services in other specified circumstances: Secondary | ICD-10-CM

## 2022-03-09 MED ORDER — OMEPRAZOLE 40 MG PO CPDR: 40.0000 mg | DELAYED_RELEASE_CAPSULE | Freq: Two times a day (BID) | ORAL | 0 refills | Status: DC

## 2022-03-09 NOTE — Patient Instructions (Addendum)
COVID-19 vaccination significantly lowers your risk of severe illness, hospitalization, and death if you get infected. Compared to people who are up to date with their COVID-19 vaccinations, unvaccinated people aremore likely to get COVID-19, much more likely to be hospitalized with COVID-19, and much more likely to die from COVID-19. Like all vaccines, COVID-19 vaccines are not 100% effective at preventing infection. Some people who are up to date with their COVID-19 vaccinations will get COVID-19 breakthrough infection. However, staying up to date with your COVID-19 vaccinations means that you are less likely to have a breakthrough infection and, if you do get sick, you are less likely to get severely ill or die. Staying up to date with COVID-19 vaccination also means you are less likely to spread the disease to others and increases your protection against new variants of SARS-CoV-2, the virus that causes COVID-19.   -----------------------------------------------------   Gastroesophageal Reflux Disease, Adult Gastroesophageal reflux (GER) happens when acid from the stomach flows up into the tube that connects the mouth and the stomach (esophagus). Normally, food travels down the esophagus and stays in the stomach to be digested. However, when a person has GER, food and stomach acid sometimes move back up into the esophagus. If this becomes a more serious problem, the person may be diagnosed with a disease called gastroesophageal reflux disease (GERD). GERD occurs when the reflux: Happens often. Causes frequent or severe symptoms. Causes problems such as damage to the esophagus. When stomach acid comes in contact with the esophagus, the acid may cause inflammation in the esophagus. Over time, GERD may create small holes (ulcers) in the lining of the esophagus. What are the causes? This condition is caused by a problem with the muscle between the esophagus and the stomach (lower esophageal sphincter,  or LES). Normally, the LES muscle closes after food passes through the esophagus to the stomach. When the LES is weakened or abnormal, it does not close properly, and that allows food and stomach acid to go back up into the esophagus. The LES can be weakened by certain dietary substances, medicines, and medical conditions, including: Tobacco use. Pregnancy. Having a hiatal hernia. Alcohol use. Certain foods and beverages, such as coffee, chocolate, onions, and peppermint. What increases the risk? You are more likely to develop this condition if you: Have an increased body weight. Have a connective tissue disorder. Take NSAIDs, such as ibuprofen. What are the signs or symptoms? Symptoms of this condition include: Heartburn. Difficult or painful swallowing and the feeling of having a lump in the throat. A bitter taste in the mouth. Bad breath and having a large amount of saliva. Having an upset or bloated stomach and belching. Chest pain. Different conditions can cause chest pain. Make sure you see your health care provider if you experience chest pain. Shortness of breath or wheezing. Ongoing (chronic) cough or a nighttime cough. Wearing away of tooth enamel. Weight loss. How is this diagnosed? This condition may be diagnosed based on a medical history and a physical exam. To determine if you have mild or severe GERD, your health care provider may also monitor how you respond to treatment. You may also have tests, including: A test to examine your stomach and esophagus with a small camera (endoscopy). A test that measures the acidity level in your esophagus. A test that measures how much pressure is on your esophagus. A barium swallow or modified barium swallow test to show the shape, size, and functioning of your esophagus. How is this treated?  Treatment for this condition may vary depending on how severe your symptoms are. Your health care provider may recommend: Changes to your  diet. Medicine. Surgery. The goal of treatment is to help relieve your symptoms and to prevent complications. Follow these instructions at home: Eating and drinking  Follow a diet as recommended by your health care provider. This may involve avoiding foods and drinks such as: Coffee and tea, with or without caffeine. Drinks that contain alcohol. Energy drinks and sports drinks. Carbonated drinks or sodas. Chocolate and cocoa. Peppermint and mint flavorings. Garlic and onions. Horseradish. Spicy and acidic foods, including peppers, chili powder, curry powder, vinegar, hot sauces, and barbecue sauce. Citrus fruit juices and citrus fruits, such as oranges, lemons, and limes. Tomato-based foods, such as red sauce, chili, salsa, and pizza with red sauce. Fried and fatty foods, such as donuts, french fries, potato chips, and high-fat dressings. High-fat meats, such as hot dogs and fatty cuts of red and white meats, such as rib eye steak, sausage, ham, and bacon. High-fat dairy items, such as whole milk, butter, and cream cheese. Eat small, frequent meals instead of large meals. Avoid drinking large amounts of liquid with your meals. Avoid eating meals during the 2-3 hours before bedtime. Avoid lying down right after you eat. Do not exercise right after you eat. Lifestyle  Do not use any products that contain nicotine or tobacco. These products include cigarettes, chewing tobacco, and vaping devices, such as e-cigarettes. If you need help quitting, ask your health care provider. Try to reduce your stress by using methods such as yoga or meditation. If you need help reducing stress, ask your health care provider. If you are overweight, reduce your weight to an amount that is healthy for you. Ask your health care provider for guidance about a safe weight loss goal. General instructions Pay attention to any changes in your symptoms. Take over-the-counter and prescription medicines only as  told by your health care provider. Do not take aspirin, ibuprofen, or other NSAIDs unless your health care provider told you to take these medicines. Wear loose-fitting clothing. Do not wear anything tight around your waist that causes pressure on your abdomen. Raise (elevate) the head of your bed about 6 inches (15 cm). You can use a wedge to do this. Avoid bending over if this makes your symptoms worse. Keep all follow-up visits. This is important. Contact a health care provider if: You have: New symptoms. Unexplained weight loss. Difficulty swallowing or it hurts to swallow. Wheezing or a persistent cough. A hoarse voice. Your symptoms do not improve with treatment. Get help right away if: You have sudden pain in your arms, neck, jaw, teeth, or back. You suddenly feel sweaty, dizzy, or light-headed. You have chest pain or shortness of breath. You vomit and the vomit is green, yellow, or black, or it looks like blood or coffee grounds. You faint. You have stool that is red, bloody, or black. You cannot swallow, drink, or eat. These symptoms may represent a serious problem that is an emergency. Do not wait to see if the symptoms will go away. Get medical help right away. Call your local emergency services (911 in the [place] Do not drive yourself to the hospital. Summary Gastroesophageal reflux happens when acid from the stomach flows up into the esophagus. GERD is a disease in which the reflux happens often, causes frequent or severe symptoms, or causes problems such as damage to the esophagus. Treatment for this condition may vary depending  on how severe your symptoms are. Your health care provider may recommend diet and lifestyle changes, medicine, or surgery. Contact a health care provider if you have new or worsening symptoms. Take over-the-counter and prescription medicines only as told by your health care provider. Do not take aspirin, ibuprofen, or other NSAIDs unless your health  care provider told you to do so. Keep all follow-up visits as told by your health care provider. This is important. This information is not intended to replace advice given to you by your health care provider. Make sure you discuss any questions you have with your health care provider. Document Revised: 04/13/2020 Document Reviewed: 04/13/2020 Elsevier Patient Education  [place].

## 2022-03-09 NOTE — Progress Notes (Signed)
BP 126/90   Pulse 89   Temp 97.7 F (36.5 C)   Ht 5\' 11"  (1.803 m)   Wt 267 lb (121.1 kg)   SpO2 98%   BMI 37.24 kg/m    Subjective:    Patient ID: Kevin Wyatt, male    DOB: Aug 25, 1996, 26 y.o.   MRN: [id number]  HPI: Kevin Wyatt is a 26 y.o. male presenting on 03/09/2022 for New Patient (Initial Visit) (Pt has not been to a PCP for about 2-3 years. Pt last went to [place]. Pt would like to have an rx for omeprazole he is currently on OTC omeprazole. )   HPI    Chief Complaint  Patient presents with   New Patient (Initial Visit)    Pt has not been to a PCP for about 2-3 years. Pt last went to [place]. Pt would like to have an rx for omeprazole he is currently on OTC omeprazole.      He says omeprazole worked well for him in the past.  He says he required 40mg  bid.   He says he self-diagnosed an ulcer by researching on the internet.  He then self-treated with some leftover antibiotics his fiance had and some prilosec.    He is not working.  He says he was fired because of his GI issues.  He is now "sharing" a [employer] job with his fiance.  He has history of heavy etoh but says he never considered himself alcoholic.  He currently drinks socially.  He says he has Some depression which he feels is situational.  He Was on citalopram in the past.  He says it helped at first and then he stopped it due to it eliminated all emotion.  He says he has No SI, HI.   He talked with counselor in the past but not since 10 or 26yo.  He says he Doesn't want medication but does want [facility]       Relevant past medical, surgical, family and social history reviewed and updated as indicated. Interim medical history since our last visit reviewed. Allergies and medications reviewed and updated.   Current Outpatient Medications:    omeprazole (PRILOSEC) 40 MG capsule, Take 80 mg by mouth 2 (two) times daily., Disp: , Rfl:    diazepam (VALIUM) 5 MG tablet, Take 5 mg by mouth 2 (two) times  daily as needed. (Patient not taking: Reported on 03/09/2022), Disp: , Rfl:    famotidine (PEPCID) 20 MG tablet, Take 1 tablet (20 mg total) by mouth 2 (two) times daily. (Patient not taking: Reported on 03/09/2022), Disp: 30 tablet, Rfl: 0   hydrOXYzine (ATARAX) 25 MG tablet, Take 1 tablet (25 mg total) by mouth every 6 (six) hours. (Patient not taking: Reported on 03/09/2022), Disp: 12 tablet, Rfl: 0   potassium chloride SA (KLOR-CON M) 20 MEQ tablet, Take 1 tablet (20 mEq total) by mouth 2 (two) times daily for 3 days., Disp: 6 tablet, Rfl: 0    Review of Systems  Per HPI unless specifically indicated above     Objective:    BP 126/90   Pulse 89   Temp 97.7 F (36.5 C)   Ht 5\' 11"  (1.803 m)   Wt 267 lb (121.1 kg)   SpO2 98%   BMI 37.24 kg/m   Wt Readings from Last 3 Encounters:  03/09/22 267 lb (121.1 kg)  11/28/21 265 lb (120.2 kg)  12/26/20 256 lb (116.1 kg)    Physical Exam Vitals reviewed.  Constitutional:      General: He is not in acute distress.    Appearance: He is well-developed. He is not ill-appearing.  HENT:     Head: Normocephalic and atraumatic.     Right Ear: Tympanic membrane and ear canal normal.     Left Ear: Tympanic membrane and ear canal normal.     Ears:     Comments: Gauges bilaterally Eyes:     Extraocular Movements: Extraocular movements intact.     Conjunctiva/sclera: Conjunctivae normal.     Pupils: Pupils are equal, round, and reactive to light.  Neck:     Thyroid: No thyromegaly.  Cardiovascular:     Rate and Rhythm: Normal rate and regular rhythm.  Pulmonary:     Effort: Pulmonary effort is normal.     Breath sounds: Normal breath sounds. No wheezing or rales.  Abdominal:     General: Bowel sounds are normal.     Palpations: Abdomen is soft. There is no mass.     Tenderness: There is no abdominal tenderness.  Musculoskeletal:     Cervical back: Neck supple.     Right lower leg: No edema.     Left lower leg: No edema.   Lymphadenopathy:     Cervical: No cervical adenopathy.  Skin:    General: Skin is warm and dry.     Findings: No rash.  Neurological:     Mental Status: He is alert and oriented to person, place, and time.     Motor: No weakness.     Gait: Gait is intact.     Deep Tendon Reflexes:     Reflex Scores:      Patellar reflexes are 2+ on the right side and 2+ on the left side. Psychiatric:        Attention and Perception: Attention normal.        Speech: Speech normal.        Behavior: Behavior normal. Behavior is cooperative.           Assessment & Plan:   Encounter Diagnoses  Name Primary?   Encounter to establish care Yes   Gastroesophageal reflux disease, unspecified whether esophagitis present    Depression, unspecified depression type    Elevated LFTs    Obesity, unspecified classification, unspecified obesity type, unspecified whether serious comorbidity present    Screening cholesterol level    Current every day vaping      -will update labs- Lipids cmp -rx omeprazole- to [facility] (so he can start it today) and [facility] (it is free).  Will treat for 3 months for gastritis and then reduce to qd.  Discussed with pt who is in agreement with plan.  -pt is educated at length on lifestyle changes and food changes to help his gerd.  He was given handout -pt is educated and encouraged to get Covid vacination -pt is offered appointment to see counselor today (due to counselor had a cancellation in his schedule) but pt declined saying taht his fiance and 2 children (ages 3yo and 11yo ) were in the car waiting for him .  He is scheduled to see counselor next week -pt is scheduled to follow up for recheck 1 month.  He is to contact office sooner prn

## 2022-03-15 ENCOUNTER — Telehealth: Payer: Self-pay

## 2022-03-15 NOTE — Telephone Encounter (Signed)
Called for follow up after [facility]. Client reports that all went well, provider sent prescription for Omeprazole to [facility] to be filled until he received the medication in the mail from [facility]. Client is aware that he has labs to be drawn before his follow up on 04/12/22.  He also has an appointment with [facility] on 03/22/22 at 3 PM.  Client expressed no further questions or needs at this time. Discussed client to call [facility] if he has any other questions we may assist him with.   Francee Nodal RN Clara Gunn/[facility]

## 2022-03-17 ENCOUNTER — Other Ambulatory Visit (HOSPITAL_COMMUNITY)
Admission: RE | Admit: 2022-03-17 | Discharge: 2022-03-17 | Disposition: A | Discharge status: Home / Self Care | Payer: 59 | Source: Ambulatory Visit | Attending: Physician Assistant | Admitting: Physician Assistant

## 2022-03-17 DIAGNOSIS — R7989 Other specified abnormal findings of blood chemistry: Secondary | ICD-10-CM | POA: Diagnosis not present

## 2022-03-17 DIAGNOSIS — Z13220 Encounter for screening for lipoid disorders: Secondary | ICD-10-CM | POA: Diagnosis present

## 2022-03-17 LAB — COMPREHENSIVE METABOLIC PANEL WITH GFR
ALT: 45 U/L — ABNORMAL HIGH (ref 0–44)
AST: 22 U/L (ref 15–41)
Albumin: 4 g/dL (ref 3.5–5.0)
Alkaline Phosphatase: 66 U/L (ref 38–126)
Anion gap: 4 — ABNORMAL LOW (ref 5–15)
BUN: 15 mg/dL (ref 6–20)
CO2: 27 mmol/L (ref 22–32)
Calcium: 9 mg/dL (ref 8.9–10.3)
Chloride: 106 mmol/L (ref 98–111)
Creatinine, Ser: 1.05 mg/dL (ref 0.61–1.24)
GFR, Estimated: >60 mL/min
Glucose, Bld: 106 mg/dL — ABNORMAL HIGH (ref 70–99)
Potassium: 3.6 mmol/L (ref 3.5–5.1)
Sodium: 137 mmol/L (ref 135–145)
Total Bilirubin: 0.5 mg/dL (ref 0.3–1.2)
Total Protein: 7.3 g/dL (ref 6.5–8.1)

## 2022-03-17 LAB — LIPID PANEL
Cholesterol: 167 mg/dL (ref 0–200)
HDL: 41 mg/dL (ref >40)
LDL Cholesterol: 115 mg/dL — ABNORMAL HIGH (ref 0–99)
Total CHOL/HDL Ratio: 4.1 RATIO
Triglycerides: 54 mg/dL (ref <150)
VLDL: 11 mg/dL (ref 0–40)

## 2022-03-22 ENCOUNTER — Ambulatory Visit: Payer: 59 | Admitting: Licensed Clinical Social Worker

## 2022-03-22 DIAGNOSIS — F4321 Adjustment disorder with depressed mood: Secondary | ICD-10-CM

## 2022-03-22 DIAGNOSIS — F32A Depression, unspecified: Secondary | ICD-10-CM

## 2022-03-22 NOTE — Progress Notes (Signed)
[facility] engaged patient in initial [facility] session. [facility] provided active listening and validation as patient shared about father's passing in December, past trauma, and unstable living situations in past few months, and depression and grief related. [facility] led patient in 4-7-8 breathing exercise and encouraged physical exercise a few times per week, patient agreeable to trying.

## 2022-04-06 ENCOUNTER — Ambulatory Visit: Payer: 59 | Admitting: Licensed Clinical Social Worker

## 2022-04-06 DIAGNOSIS — F4321 Adjustment disorder with depressed mood: Secondary | ICD-10-CM

## 2022-04-06 DIAGNOSIS — F32A Depression, unspecified: Secondary | ICD-10-CM

## 2022-04-06 NOTE — Progress Notes (Signed)
[facility] engaged patient in termination session. [facility] assisted patient in processing feelings and thoughts related to termination. [facility] led patient in progressive muscle relaxation. Patient interested in continued [facility] services when next [facility] is established at clinic.

## 2022-04-12 ENCOUNTER — Ambulatory Visit: Payer: 59 | Admitting: Physician Assistant

## 2023-05-04 ENCOUNTER — Emergency Department (HOSPITAL_COMMUNITY)
Admission: EM | Admit: 2023-05-04 | Discharge: 2023-05-04 | Disposition: A | Discharge status: Home / Self Care | Payer: MEDICAID | Attending: Emergency Medicine | Admitting: Emergency Medicine

## 2023-05-04 ENCOUNTER — Emergency Department (HOSPITAL_COMMUNITY): Payer: MEDICAID

## 2023-05-04 ENCOUNTER — Other Ambulatory Visit: Payer: Self-pay

## 2023-05-04 ENCOUNTER — Encounter (HOSPITAL_COMMUNITY): Payer: Self-pay | Admitting: *Deleted

## 2023-05-04 DIAGNOSIS — K2920 Alcoholic gastritis without bleeding: Secondary | ICD-10-CM

## 2023-05-04 DIAGNOSIS — R000 Tachycardia, unspecified: Secondary | ICD-10-CM | POA: Diagnosis not present

## 2023-05-04 DIAGNOSIS — R1010 Upper abdominal pain, unspecified: Secondary | ICD-10-CM | POA: Diagnosis present

## 2023-05-04 LAB — COMPREHENSIVE METABOLIC PANEL
ALT: 65 U/L — ABNORMAL HIGH (ref 0–44)
AST: 33 U/L (ref 15–41)
Albumin: 4.6 g/dL (ref 3.5–5.0)
Alkaline Phosphatase: 90 U/L (ref 38–126)
Anion gap: 14 (ref 5–15)
BUN: 10 mg/dL (ref 6–20)
CO2: 22 mmol/L (ref 22–32)
Calcium: 9.3 mg/dL (ref 8.9–10.3)
Chloride: 98 mmol/L (ref 98–111)
Creatinine, Ser: 1.11 mg/dL (ref 0.61–1.24)
GFR, Estimated: >60 mL/min (ref >60)
Glucose, Bld: 112 mg/dL — ABNORMAL HIGH (ref 70–99)
Potassium: 3.1 mmol/L — ABNORMAL LOW (ref 3.5–5.1)
Sodium: 134 mmol/L — ABNORMAL LOW (ref 135–145)
Total Bilirubin: 1.3 mg/dL — ABNORMAL HIGH (ref 0.3–1.2)
Total Protein: 8.1 g/dL (ref 6.5–8.1)

## 2023-05-04 LAB — URINALYSIS, ROUTINE W REFLEX MICROSCOPIC
Bilirubin Urine: NEGATIVE
Glucose, UA: NEGATIVE mg/dL
Hgb urine dipstick: NEGATIVE
Ketones, ur: 20 mg/dL — AB
Leukocytes,Ua: NEGATIVE
Nitrite: NEGATIVE
Protein, ur: 100 mg/dL — AB
Specific Gravity, Urine: 1.019 (ref 1.005–1.030)
pH: 7 (ref 5.0–8.0)

## 2023-05-04 LAB — CBC WITH DIFFERENTIAL/PLATELET
Abs Immature Granulocytes: 0.07 10*3/uL (ref 0.00–0.07)
Basophils Absolute: 0 10*3/uL (ref 0.0–0.1)
Basophils Relative: 0 %
Eosinophils Absolute: 0 10*3/uL (ref 0.0–0.5)
Eosinophils Relative: 0 %
HCT: 44.3 % (ref 39.0–52.0)
Hemoglobin: 15.6 g/dL (ref 13.0–17.0)
Immature Granulocytes: 0 %
Lymphocytes Relative: 14 %
Lymphs Abs: 2.1 10*3/uL (ref 0.7–4.0)
MCH: 29.1 pg (ref 26.0–34.0)
MCHC: 35.2 g/dL (ref 30.0–36.0)
MCV: 82.5 fL (ref 80.0–100.0)
Monocytes Absolute: 1.6 10*3/uL — ABNORMAL HIGH (ref 0.1–1.0)
Monocytes Relative: 10 %
Neutro Abs: 11.7 10*3/uL — ABNORMAL HIGH (ref 1.7–7.7)
Neutrophils Relative %: 76 %
Platelets: 400 10*3/uL (ref 150–400)
RBC: 5.37 MIL/uL (ref 4.22–5.81)
RDW: 12.2 % (ref 11.5–15.5)
WBC: 15.6 10*3/uL — ABNORMAL HIGH (ref 4.0–10.5)
nRBC: 0 % (ref 0.0–0.2)

## 2023-05-04 LAB — ETHANOL: Alcohol, Ethyl (B): <10 mg/dL (ref <10)

## 2023-05-04 LAB — LIPASE, BLOOD: Lipase: 37 U/L (ref 11–51)

## 2023-05-04 MED ORDER — SODIUM CHLORIDE 0.9 % IV SOLN: INTRAVENOUS | Status: DC

## 2023-05-04 MED ORDER — SODIUM CHLORIDE 0.9 % IV BOLUS
1000.0000 mL | Freq: Once | INTRAVENOUS | Status: AC
Start: 2023-05-04 — End: 2023-05-04
  Administered 2023-05-04: 1000 mL via INTRAVENOUS

## 2023-05-04 MED ORDER — ONDANSETRON 8 MG PO TBDP: 8.0000 mg | ORAL_TABLET | Freq: Three times a day (TID) | ORAL | 0 refills | Status: AC | PRN

## 2023-05-04 MED ORDER — PANTOPRAZOLE SODIUM 20 MG PO TBEC: 20.0000 mg | DELAYED_RELEASE_TABLET | Freq: Every day | ORAL | 0 refills | Status: DC

## 2023-05-04 MED ORDER — DIAZEPAM 5 MG/ML IJ SOLN
5.0000 mg | Freq: Once | INTRAMUSCULAR | Status: AC
Start: 2023-05-04 — End: 2023-05-04
  Administered 2023-05-04: 5 mg via INTRAVENOUS
  Filled 2023-05-04: qty 2

## 2023-05-04 MED ORDER — PANTOPRAZOLE SODIUM 40 MG IV SOLR
40.0000 mg | Freq: Once | INTRAVENOUS | Status: AC
  Administered 2023-05-04: 40 mg via INTRAVENOUS
  Filled 2023-05-04: qty 10

## 2023-05-04 MED ORDER — CHLORDIAZEPOXIDE HCL 25 MG PO CAPS: ORAL_CAPSULE | ORAL | 0 refills | Status: DC

## 2023-05-04 MED ORDER — IOHEXOL 300 MG/ML  SOLN
100.0000 mL | Freq: Once | INTRAMUSCULAR | Status: AC | PRN
  Administered 2023-05-04: 100 mL via INTRAVENOUS

## 2023-05-04 MED ORDER — MORPHINE SULFATE (PF) 4 MG/ML IV SOLN
4.0000 mg | Freq: Once | INTRAVENOUS | Status: AC
  Administered 2023-05-04: 4 mg via INTRAVENOUS
  Filled 2023-05-04: qty 1

## 2023-05-04 MED ORDER — SUCRALFATE 1 G PO TABS: 1.0000 g | ORAL_TABLET | Freq: Three times a day (TID) | ORAL | 0 refills | Status: DC

## 2023-05-04 NOTE — Discharge Instructions (Signed)
Take the medications as prescribed to help with your acid reflux and stomach problems.  Avoid drinking alcohol.  The Librium can help with anxiety and alcohol withdrawal symptoms.

## 2023-05-04 NOTE — ED Provider Notes (Signed)
EMERGENCY DEPARTMENT AT [facility] Provider Note   CSN: [id number] Arrival date & time: 05/04/23  1353     History  Chief Complaint  Patient presents with   Alcohol Problem    Kevin Wyatt is a 27 y.o. male.   Alcohol Problem     Patient has a history of ADHD anxiety, reflux depression.  Patient presents to the ED with concerns of possible alcohol withdrawal.  Patient states he is a binge drinker.  He drinks on the weekends.  He has been drinking for 4 days but does not drink daily.  Patient states over the last day or 2 he has been having some trouble with discomfort in his upper abdomen.  It goes into his chest.  He feels like he is having bad acid reflux.  Patient has also felt his heart pounding and beating fast.  No known fevers.  He has had some nausea vomiting.  No blood in the stool Home Medications Prior to Admission medications   Medication Sig Start Date End Date Taking? Authorizing Provider  chlordiazePOXIDE (LIBRIUM) 25 MG capsule 50mg  PO TID x 1D, then 25-50mg  PO BID X 1D, then 25-50mg  PO QD X 1D 05/04/23  Yes Linwood Dibbles, MD  ondansetron (ZOFRAN-ODT) 8 MG disintegrating tablet Take 1 tablet (8 mg total) by mouth every 8 (eight) hours as needed for nausea or vomiting. 05/04/23  Yes Linwood Dibbles, MD  pantoprazole (PROTONIX) 20 MG tablet Take 1 tablet (20 mg total) by mouth daily. 05/04/23  Yes Linwood Dibbles, MD  sucralfate (CARAFATE) 1 g tablet Take 1 tablet (1 g total) by mouth 4 (four) times daily -  with meals and at bedtime. 05/04/23  Yes Linwood Dibbles, MD  Omeprazole-Sodium Bicarbonate (ZEGERID) 20-1100 MG CAPS capsule Take 1 capsule by mouth 2 (two) times daily before a meal. Patient not taking: Reported on 07/26/2020 09/21/16 07/27/20  Gelene Mink, NP      Allergies    Patient has no known allergies.    Review of Systems   Review of Systems  Physical Exam Updated Vital Signs BP (!) 112/56   Pulse (!) 108   Temp (!) 97.4 F (36.3 C) (Oral)    Resp 19   Ht 1.803 m (5\' 11" )   Wt 120.2 kg   SpO2 99%   BMI 36.96 kg/m  Physical Exam Vitals and nursing note reviewed.  Constitutional:      General: He is not in acute distress.    Appearance: He is well-developed.  HENT:     Head: Normocephalic and atraumatic.     Right Ear: External ear normal.     Left Ear: External ear normal.  Eyes:     General: No scleral icterus.       Right eye: No discharge.        Left eye: No discharge.     Conjunctiva/sclera: Conjunctivae normal.  Neck:     Trachea: No tracheal deviation.  Cardiovascular:     Rate and Rhythm: Regular rhythm. Tachycardia present.  Pulmonary:     Effort: Pulmonary effort is normal. No respiratory distress.     Breath sounds: Normal breath sounds. No stridor. No wheezing or rales.  Abdominal:     General: Bowel sounds are normal. There is no distension.     Palpations: Abdomen is soft.     Tenderness: There is no abdominal tenderness. There is no guarding or rebound.  Musculoskeletal:  General: No tenderness or deformity.     Cervical back: Neck supple.  Skin:    General: Skin is warm and dry.     Findings: No rash.  Neurological:     General: No focal deficit present.     Mental Status: He is alert.     Cranial Nerves: No cranial nerve deficit, dysarthria or facial asymmetry.     Sensory: No sensory deficit.     Motor: No tremor, abnormal muscle tone or seizure activity.     Coordination: Coordination normal.  Psychiatric:        Mood and Affect: Mood is anxious.     ED Results / Procedures / Treatments   Labs (all labs ordered are listed, but only abnormal results are displayed) Labs Reviewed  COMPREHENSIVE METABOLIC PANEL - Abnormal; Notable for the following components:      Result Value   Sodium 134 (*)    Potassium 3.1 (*)    Glucose, Bld 112 (*)    ALT 65 (*)    Total Bilirubin 1.3 (*)    All other components within normal limits  CBC WITH DIFFERENTIAL/PLATELET - Abnormal; Notable  for the following components:   WBC 15.6 (*)    Neutro Abs 11.7 (*)    Monocytes Absolute 1.6 (*)    All other components within normal limits  URINALYSIS, ROUTINE W REFLEX MICROSCOPIC - Abnormal; Notable for the following components:   Ketones, ur 20 (*)    Protein, ur 100 (*)    Bacteria, UA RARE (*)    All other components within normal limits  LIPASE, BLOOD  ETHANOL    EKG EKG Interpretation Date/Time:  Thursday May 04 2023 15:52:45 EDT Ventricular Rate:  102 PR Interval:  135 QRS Duration:  99 QT Interval:  335 QTC Calculation: 437 R Axis:   122  Text Interpretation: Sinus tachycardia Since last tracing rate faster Confirmed by Linwood Dibbles [id number]) on 05/04/2023 3:59:48 PM  Radiology CT ABDOMEN PELVIS W CONTRAST  Result Date: 05/04/2023 CLINICAL DATA:  Abdominal pain with nausea and vomiting EXAM: CT ABDOMEN AND PELVIS WITH CONTRAST TECHNIQUE: Multidetector CT imaging of the abdomen and pelvis was performed using the standard protocol following bolus administration of intravenous contrast. RADIATION DOSE REDUCTION: This exam was performed according to the departmental dose-optimization program which includes automated exposure control, adjustment of the mA and/or kV according to patient size and/or use of iterative reconstruction technique. CONTRAST:  OMNIPAQUE IOHEXOL 300 MG/ML  SOLN COMPARISON:  CT angiogram chest abdomen and pelvis December 26, 2020 FINDINGS: Lower chest: The visualized bibasilar lungs are clear. Normal heart size and no pericardial effusion. Hepatobiliary: Hepatic steatosis. No focal liver abnormality is seen. No gallstones, gallbladder wall thickening, or biliary dilatation. Pancreas: Unremarkable. No pancreatic ductal dilatation or surrounding inflammatory changes. Spleen: Normal in size without focal abnormality. Adrenals/Urinary Tract: Adrenal glands are unremarkable. Kidneys are normal, without renal calculi, focal lesion, or hydronephrosis. Mild  circumferential bladder wall thickening, likely due to underdistention. No significant pericystic fat stranding. Stomach/Bowel: Stomach is within normal limits. Appendix appears normal. No evidence of bowel wall thickening, distention, or inflammatory changes. Vascular/Lymphatic: No significant vascular findings are present. No enlarged abdominal or pelvic lymph nodes. Reproductive: Prostate is unremarkable. Other: No abdominal wall hernia or abnormality. No abdominopelvic ascites. Musculoskeletal: No acute or significant osseous findings. IMPRESSION: 1. No definite etiology for abdominal pain is identified. 2. Mild circumferential bladder wall thickening, likely due to under-distention. However, recommend correlation with urinalysis. 3. Hepatic  steatosis. Electronically Signed   By: Jacob Moores M.D.   On: 05/04/2023 17:39   DG Chest 2 View  Result Date: 05/04/2023 CLINICAL DATA:  Chest pain EXAM: CHEST - 2 VIEW COMPARISON:  12/26/2020 FINDINGS: The heart size and mediastinal contours are within normal limits. Both lungs are clear. The visualized skeletal structures are unremarkable. IMPRESSION: No active cardiopulmonary disease. Electronically Signed   By: Ernie Avena M.D.   On: 05/04/2023 15:48    Procedures Procedures    Medications Ordered in ED Medications  sodium chloride 0.9 % bolus 1,000 mL (0 mLs Intravenous Stopped 05/04/23 1657)    And  0.9 %  sodium chloride infusion ( Intravenous New Bag/Given 05/04/23 1657)  pantoprazole (PROTONIX) injection 40 mg (40 mg Intravenous Given 05/04/23 1550)  diazepam (VALIUM) injection 5 mg (5 mg Intravenous Given 05/04/23 1550)  iohexol (OMNIPAQUE) 300 MG/ML solution 100 mL (100 mLs Intravenous Contrast Given 05/04/23 1723)  morphine (PF) 4 MG/ML injection 4 mg (4 mg Intravenous Given 05/04/23 1750)    ED Course/ Medical Decision Making/ A&P Clinical Course as of 05/04/23 1818  Thu May 04, 2023  1559 CBC with Diff(!) Leukocytosis noted [JK]   1708 CBC with Diff(!) Metabolic panel shows potassium level decreased at 3.1.  Lipase did increase at 37.  Alcohol negative. [JK]  1708 X-ray without acute findings [JK]  1743 Abdominal pelvic CT without acute findings. [JK]  1813 Urinalysis suggestive of infection [JK]    Clinical Course User Index [JK] Linwood Dibbles, MD                             Medical Decision Making Parental diagnosis includes but not limited to alcohol withdrawal, gastritis, pancreatitis, anxiety, bowel obstruction  Problems Addressed: Acute alcoholic gastritis without hemorrhage: acute illness or injury that poses a threat to life or bodily functions  Amount and/or Complexity of Data Reviewed Labs: ordered. Decision-making details documented in ED Course. Radiology: ordered and independent interpretation performed.  Risk Prescription drug management.   Presented to the ED for evaluation abdominal pain.  He was concerned that he was having alcohol withdrawal.  Patient does admit to binge drinking but does not drink alcohol regularly.  Unlikely that his symptoms are related to alcohol withdrawal.  Patient does appear anxious however.  I think that is a component of his symptoms.  He does have history anxiety at baseline.  Patient's ED workup is reassuring.  No evidence of pancreatitis.  No hepatitis.  No signs of cholecystitis.  CT scan without evidence of bowel obstruction.  Patient was treated with medications for acid reflux nausea and noted some improvement.  Will discharge home with prescription of antacids Carafate and Zofran.  Overall low suspicion for alcohol withdrawal but I will give him a few days of Librium.  This may also help with his anxiety symptoms.  Encouraged him to stop drinking alcohol        Final Clinical Impression(s) / ED Diagnoses Final diagnoses:  Acute alcoholic gastritis without hemorrhage    Rx / DC Orders ED Discharge Orders          Ordered    pantoprazole (PROTONIX) 20  MG tablet  Daily        05/04/23 1816    sucralfate (CARAFATE) 1 g tablet  3 times daily with meals & bedtime        05/04/23 1816    ondansetron (ZOFRAN-ODT) 8 MG disintegrating tablet  Every 8 hours PRN        05/04/23 1816    chlordiazePOXIDE (LIBRIUM) 25 MG capsule        05/04/23 1816              Linwood Dibbles, MD 05/04/23 1818

## 2023-05-04 NOTE — ED Triage Notes (Signed)
Pt last drank at 1800 yesterday evening.. pt with N/V and tremors.

## 2023-10-23 ENCOUNTER — Encounter: Payer: Self-pay | Admitting: Internal Medicine

## 2024-01-31 ENCOUNTER — Ambulatory Visit: Payer: MEDICAID | Admitting: Gastroenterology

## 2024-01-31 NOTE — Progress Notes (Deleted)
 GI Office Note    Referring Provider: Minus Amel, MD Primary Care Physician:  Minus Amel, MD  Primary Gastroenterologist: Rheba Cedar, MD  Chief Complaint   No chief complaint on file.    History of Present Illness   Kevin Wyatt is a 28 y.o. male presenting today for ***. Seen in 2017 for GERD.   H.pylori stool antigen positive 10/2022.     CT A/P with contrast 04/2023: IMPRESSION: 1. No definite etiology for abdominal pain is identified. 2. Mild circumferential bladder wall thickening, likely due to under-distention. However, recommend correlation with urinalysis. 3. Hepatic steatosis.      Medications   Current Outpatient Medications  Medication Sig Dispense Refill   chlordiazePOXIDE (LIBRIUM) 25 MG capsule 50mg  PO TID x 1D, then 25-50mg  PO BID X 1D, then 25-50mg  PO QD X 1D 10 capsule 0   ondansetron (ZOFRAN-ODT) 8 MG disintegrating tablet Take 1 tablet (8 mg total) by mouth every 8 (eight) hours as needed for nausea or vomiting. 12 tablet 0   pantoprazole (PROTONIX) 20 MG tablet Take 1 tablet (20 mg total) by mouth daily. 20 tablet 0   sucralfate (CARAFATE) 1 g tablet Take 1 tablet (1 g total) by mouth 4 (four) times daily -  with meals and at bedtime. 40 tablet 0   No current facility-administered medications for this visit.    Allergies   Allergies as of 01/31/2024   (No Known Allergies)    Past Medical History   Past Medical History:  Diagnosis Date   ADHD    ADHD (attention deficit hyperactivity disorder)    Anxiety    Depression    Dizziness    GERD (gastroesophageal reflux disease)    w/ prrosis & erucatation   Nausea    Obesity    Vasovagal syncope     Past Surgical History   Past Surgical History:  Procedure Laterality Date   None      Past Family History   Family History  Problem Relation Age of Onset   Hypertension Mother    Cancer Father    Hypertension Father    Schizophrenia Maternal Uncle    Bipolar  disorder Maternal Uncle    Alzheimer's disease Maternal Grandmother    Stroke Maternal Grandmother    Heart attack Maternal Grandmother    Hypertension Maternal Grandfather    Hypertension Paternal Grandmother    Heart attack Paternal Grandmother    Lung cancer Paternal Grandmother    Cancer Paternal Grandfather    Colon cancer Neg Hx     Past Social History   Social History   Socioeconomic History   Marital status: Single    Spouse name: Not on file   Number of children: Not on file   Years of education: Not on file   Highest education level: Not on file  Occupational History   Not on file  Tobacco Use   Smoking status: Former    Current packs/day: 0.00    Average packs/day: 0.5 packs/day for 5.0 years (2.5 ttl pk-yrs)    Types: Cigarettes    Start date: 10/18/2011    Quit date: 10/17/2016    Years since quitting: 7.2   Smokeless tobacco: Never   Tobacco comments:    uses vape   Vaping Use   Vaping status: Every Day  Substance and Sexual Activity   Alcohol use: Yes    Comment: couple drinks of etoh 1-2 times a month   Drug use: Not  Currently    Types: Marijuana, Cocaine    Comment: no MJ since 28yo. no cocaine since 28yo   Sexual activity: Yes    Birth control/protection: Condom  Other Topics Concern   Not on file  Social History Narrative   Not on file   Social Drivers of Health   Financial Resource Strain: Not on file  Food Insecurity: Not on file  Transportation Needs: Not on file  Physical Activity: Not on file  Stress: Not on file  Social Connections: Not on file  Intimate Partner Violence: Not on file    Review of Systems   General: Negative for anorexia, weight loss, fever, chills, fatigue, weakness. Eyes: Negative for vision changes.  ENT: Negative for hoarseness, difficulty swallowing , nasal congestion. CV: Negative for chest pain, angina, palpitations, dyspnea on exertion, peripheral edema.  Respiratory: Negative for dyspnea at rest, dyspnea  on exertion, cough, sputum, wheezing.  GI: See history of present illness. GU:  Negative for dysuria, hematuria, urinary incontinence, urinary frequency, nocturnal urination.  MS: Negative for joint pain, low back pain.  Derm: Negative for rash or itching.  Neuro: Negative for weakness, abnormal sensation, seizure, frequent headaches, memory loss,  confusion.  Psych: Negative for anxiety, depression, suicidal ideation, hallucinations.  Endo: Negative for unusual weight change.  Heme: Negative for bruising or bleeding. Allergy: Negative for rash or hives.  Physical Exam   There were no vitals taken for this visit.   General: Well-nourished, well-developed in no acute distress.  Head: Normocephalic, atraumatic.   Eyes: Conjunctiva pink, no icterus. Mouth: Oropharyngeal mucosa moist and pink , no lesions erythema or exudate. Neck: Supple without thyromegaly, masses, or lymphadenopathy.  Lungs: Clear to auscultation bilaterally.  Heart: Regular rate and rhythm, no murmurs rubs or gallops.  Abdomen: Bowel sounds are normal, nontender, nondistended, no hepatosplenomegaly or masses,  no abdominal bruits or hernia, no rebound or guarding.   Rectal: *** Extremities: No lower extremity edema. No clubbing or deformities.  Neuro: Alert and oriented x 4 , grossly normal neurologically.  Skin: Warm and dry, no rash or jaundice.   Psych: Alert and cooperative, normal mood and affect.  Labs   Lab Results  Component Value Date   NA 134 (L) 05/04/2023   CL 98 05/04/2023   K 3.1 (L) 05/04/2023   CO2 22 05/04/2023   BUN 10 05/04/2023   CREATININE 1.11 05/04/2023   GFRNONAA >60 05/04/2023   CALCIUM 9.3 05/04/2023   ALBUMIN 4.6 05/04/2023   GLUCOSE 112 (H) 05/04/2023   Lab Results  Component Value Date   ALT 65 (H) 05/04/2023   AST 33 05/04/2023   ALKPHOS 90 05/04/2023   BILITOT 1.3 (H) 05/04/2023   Lab Results  Component Value Date   WBC 15.6 (H) 05/04/2023   HGB 15.6 05/04/2023    HCT 44.3 05/04/2023   MCV 82.5 05/04/2023   PLT 400 05/04/2023   Lab Results  Component Value Date   LIPASE 37 05/04/2023    Imaging Studies   No results found.  Assessment       PLAN   ***   Trudie Fuse. Harles Lied, MHS, PA-C [facility]

## 2024-02-14 ENCOUNTER — Encounter: Payer: Self-pay | Admitting: Gastroenterology

## 2024-02-19 ENCOUNTER — Encounter: Payer: Self-pay | Admitting: Gastroenterology

## 2024-02-26 ENCOUNTER — Encounter (INDEPENDENT_AMBULATORY_CARE_PROVIDER_SITE_OTHER): Payer: Self-pay | Admitting: *Deleted

## 2024-03-19 ENCOUNTER — Ambulatory Visit (INDEPENDENT_AMBULATORY_CARE_PROVIDER_SITE_OTHER): Payer: MEDICAID | Admitting: Gastroenterology

## 2024-03-19 ENCOUNTER — Encounter (INDEPENDENT_AMBULATORY_CARE_PROVIDER_SITE_OTHER): Payer: Self-pay | Admitting: Gastroenterology

## 2024-03-19 VITALS — BP 120/80 | HR 93 | Temp 98.4°F | Ht 71.5 in | Wt 256.5 lb

## 2024-03-19 DIAGNOSIS — R748 Abnormal levels of other serum enzymes: Secondary | ICD-10-CM

## 2024-03-19 DIAGNOSIS — R109 Unspecified abdominal pain: Secondary | ICD-10-CM | POA: Diagnosis not present

## 2024-03-19 DIAGNOSIS — K760 Fatty (change of) liver, not elsewhere classified: Secondary | ICD-10-CM | POA: Diagnosis not present

## 2024-03-19 DIAGNOSIS — K219 Gastro-esophageal reflux disease without esophagitis: Secondary | ICD-10-CM

## 2024-03-19 DIAGNOSIS — Z8619 Personal history of other infectious and parasitic diseases: Secondary | ICD-10-CM

## 2024-03-19 DIAGNOSIS — Z6835 Body mass index (BMI) 35.0-35.9, adult: Secondary | ICD-10-CM | POA: Insufficient documentation

## 2024-03-19 NOTE — Patient Instructions (Signed)
 It was very nice to meet you today, as dicussed with will plan for the following :  1) Upper endoscopy , stop protonix  for 2 weeks prior to the procedure  2) labwork   3)  For elevated liver enzymes and fatty liver disease :      - walking at a brisk pace/biking at moderate intesity 2.5-5 hours per week     - use pedometer/step counter to track activity     - goal to lose 5-10% of initial body weight     - avoid suagry drinks and juices, use zero calorie beverages     - increase water intake     - eat a low carb diet with plenty of veggies and fruit     - Get sufficient sleep 7-8 hrs nightly     - maitain active lifestyle     - avoid alcohol     - recommend 2-3 cups Coffee daily     - Counsel on lowering cholesterol by having a diet rich in vegetables,          protein (avoid red meats) and good fats(fish, salmon).    ]

## 2024-03-19 NOTE — Progress Notes (Signed)
 Kevin Wyatt , M.D. Gastroenterology & Hepatology [facility] [address] [place], [place] [address] Primary Care Physician: Minus Amel, MD [address] [place] [address]  Chief Complaint: GERD, abdominal pain, elevated liver enzymes  History of Present Illness:  Kevin Wyatt is a 28 y.o. male with ADHD, chronic GERD who presents for evaluation of GERD, abdominal pain, elevated liver enzymes.  Patient today reports she is concerned about " gastritis".  His main complaint remains epigastric discomfort mostly postprandial with occasional heartburn. Patient is on Zepbound for weight loss. Stool H. pylori was + 10/2022 patient took antibiotics for 2 weeks. Patient has been on pantoprazole  20 mg BID. The patient denies having any nausea, vomiting, fever, chills, hematochezia, melena, hematemesis,, diarrhea, jaundice, pruritus or weight loss.  Last ZHY:QMVH Last Colonoscopy:none  FHx: neg for any gastrointestinal/liver disease, no malignancies Social: Previously with heavy alcohol intake 12 beers per day but currently only occasionally Surgical: no abdominal surgeries  Labs from 10/2022 hemoglobin 16 ALT 81 AST 28  Past Medical History: Past Medical History:  Diagnosis Date   ADHD    ADHD (attention deficit hyperactivity disorder)    Anxiety    Depression    Dizziness    GERD (gastroesophageal reflux disease)    w/ prrosis & erucatation   Nausea    Obesity    Vasovagal syncope     Past Surgical History: Past Surgical History:  Procedure Laterality Date   None      Family History: Family History  Problem Relation Age of Onset   Hypertension Mother    Cancer Father    Hypertension Father    Schizophrenia Maternal Uncle    Bipolar disorder Maternal Uncle    Alzheimer's disease Maternal Grandmother    Stroke Maternal Grandmother    Heart attack Maternal Grandmother    Hypertension Maternal  Grandfather    Hypertension Paternal Grandmother    Heart attack Paternal Grandmother    Lung cancer Paternal Grandmother    Cancer Paternal Grandfather    Colon cancer Neg Hx     Social History: Social History   Tobacco Use  Smoking Status Former   Current packs/day: 0.00   Average packs/day: 0.5 packs/day for 5.0 years (2.5 ttl pk-yrs)   Types: Cigarettes   Start date: 10/18/2011   Quit date: 10/17/2016   Years since quitting: 7.4  Smokeless Tobacco Never  Tobacco Comments   uses vape    Social History   Substance and Sexual Activity  Alcohol Use Not Currently   Comment: couple drinks of etoh 1-2 times a month   Social History   Substance and Sexual Activity  Drug Use Not Currently   Types: Marijuana, Cocaine   Comment: no MJ since 28yo. no cocaine since 28yo    Allergies: No Known Allergies  Medications: Current Outpatient Medications  Medication Sig Dispense Refill   ALPRAZolam (XANAX) 0.5 MG tablet Take 0.5 mg by mouth 3 (three) times daily.     olmesartan (BENICAR) 20 MG tablet Take 20 mg by mouth daily.     pantoprazole  (PROTONIX ) 20 MG tablet Take 1 tablet (20 mg total) by mouth daily. 20 tablet 0   ZEPBOUND 7.5 MG/0.5ML Pen Inject 7.5 mg into the skin once a week.     ondansetron  (ZOFRAN -ODT) 8 MG disintegrating tablet Take 1 tablet (8 mg total) by mouth every 8 (eight) hours as needed for nausea or vomiting. (Patient not taking: Reported on 03/19/2024) 12 tablet  0   sucralfate  (CARAFATE ) 1 g tablet Take 1 tablet (1 g total) by mouth 4 (four) times daily -  with meals and at bedtime. (Patient not taking: Reported on 03/19/2024) 40 tablet 0   No current facility-administered medications for this visit.    Review of Systems: GENERAL: negative for malaise, night sweats HEENT: No changes in hearing or vision, no nose bleeds or other nasal problems. NECK: Negative for lumps, goiter, pain and significant neck swelling RESPIRATORY: Negative for cough,  wheezing CARDIOVASCULAR: Negative for chest pain, leg swelling, palpitations, orthopnea GI: SEE HPI MUSCULOSKELETAL: Negative for joint pain or swelling, back pain, and muscle pain. SKIN: Negative for lesions, rash HEMATOLOGY Negative for prolonged bleeding, bruising easily, and swollen nodes. ENDOCRINE: Negative for cold or heat intolerance, polyuria, polydipsia and goiter. NEURO: negative for tremor, gait imbalance, syncope and seizures. The remainder of the review of systems is noncontributory.   Physical Exam: BP 120/80 (BP Location: Left Arm, Patient Position: Sitting, Cuff Size: Normal)   Pulse 93   Temp 98.4 F (36.9 C) (Temporal)   Ht 5' 11.5" (1.816 m)   Wt 256 lb 8 oz (116.3 kg)   BMI 35.28 kg/m  GENERAL: The patient is AO x3, in no acute distress. HEENT: Head is normocephalic and atraumatic. EOMI are intact. Mouth is well hydrated and without lesions. NECK: Supple. No masses LUNGS: Clear to auscultation. No presence of rhonchi/wheezing/rales. Adequate chest expansion HEART: RRR, normal s1 and s2. ABDOMEN: Soft, nontender, no guarding, no peritoneal signs, and nondistended. BS +. No masses.  Imaging/Labs: as above     Latest Ref Rng & Units 05/04/2023    3:44 PM 12/13/2021    8:32 PM 11/28/2021   10:46 PM  CBC  WBC 4.0 - 10.5 K/uL 15.6  11.2  9.7   Hemoglobin 13.0 - 17.0 g/dL 16.1  09.6  04.5   Hematocrit 39.0 - 52.0 % 44.3  44.8  45.7   Platelets 150 - 400 K/uL 400  315  271    No results found for: "IRON", "TIBC", "FERRITIN"  I personally reviewed and interpreted the available labs, imaging and endoscopic files.  CT abdomen pelvis 04/2023  IMPRESSION: 1. No definite etiology for abdominal pain is identified. 2. Mild circumferential bladder wall thickening, likely due to under-distention. However, recommend correlation with urinalysis. 3. Hepatic steatosis.  Impression and Plan:  Kevin Wyatt is a 28 y.o. male with ADHD, chronic GERD who presents for  evaluation of GERD, abdominal pain, elevated liver enzymes.  #Abdominal pain /Refractory GERD  #Hpylori  Patient reports postprandial abdominal discomfort which could be peptic ulcer disease, gastritis or persistent H. pylori  Patient have failed to completely respond to PPI for the past few months and symptoms have not resolved after H. pylori treatment and hence upper endoscopy is indicated to rule out any underlying lesion  Upper endoscopy off PPI with biopsies Recommend alcohol cessation  #Elevated liver enzymes #MASLD   Patient has elevated liver enzymes from labs done last year with imaging finding of hepatic steatosis This is likely due to MASLD    Fib 4 Score 0.14   On exam patient does not have signs of advanced chronic liver disease, no splenomegaly, ascites, spider angiomas, palmar eythema   Given elevation in ALT  will obtain baseline viral hepatitis profile,  and autoimmune serologies       - walking at a brisk pace/biking at moderate intesity 2.5-5 hours per week     - use pedometer/step  counter to track activity     - goal to lose 5-10% of initial body weight     - avoid suagry drinks and juices, use zero calorie beverages     - increase water intake     - eat a low carb diet with plenty of veggies and fruit     - Get sufficient sleep 7-8 hrs nightly     - maitain active lifestyle     - avoid alcohol     - recommend 2-3 cups Coffee daily     - Counsel on lowering cholesterol by having a diet rich in vegetables,          protein (avoid red meats) and good fats(fish, salmon).  All questions were answered.      Kassia Demarinis Faizan Yahshua Thibault, MD Gastroenterology and Hepatology [facility]   This chart has been completed using [facility] Dictation software, and while attempts have been made to ensure accuracy , certain words and phrases may not be transcribed as intended

## 2024-03-20 ENCOUNTER — Telehealth: Payer: Self-pay | Admitting: *Deleted

## 2024-03-20 NOTE — Telephone Encounter (Signed)
 LMOVM to return call  EGD, any room w/Dr.Ahmed, after stopping PPI for 2 weeks.

## 2024-03-21 ENCOUNTER — Encounter (INDEPENDENT_AMBULATORY_CARE_PROVIDER_SITE_OTHER): Payer: Self-pay | Admitting: *Deleted

## 2024-03-27 LAB — ENA+DNA/DS+ANTICH+CENTRO+FA...
Anti JO-1: <0.2 AI (ref 0.0–0.9)
Centromere Ab Screen: <0.2 AI (ref 0.0–0.9)
Chromatin Ab SerPl-aCnc: <0.2 AI (ref 0.0–0.9)
ENA RNP Ab: <0.2 AI (ref 0.0–0.9)
ENA SM Ab Ser-aCnc: <0.2 AI (ref 0.0–0.9)
ENA SSA (RO) Ab: <0.2 AI (ref 0.0–0.9)
ENA SSB (LA) Ab: <0.2 AI (ref 0.0–0.9)
Scleroderma (Scl-70) (ENA) Antibody, IgG: <0.2 AI (ref 0.0–0.9)
Speckled Pattern: 1:160 {titer} — ABNORMAL HIGH
dsDNA Ab: 1 [IU]/mL (ref 0–9)

## 2024-03-27 LAB — IRON,TIBC AND FERRITIN PANEL
Ferritin: 82 ng/mL (ref 30–400)
Iron Saturation: 24 % (ref 15–55)
Iron: 77 ug/dL (ref 38–169)
Total Iron Binding Capacity: 319 ug/dL (ref 250–450)
UIBC: 242 ug/dL (ref 111–343)

## 2024-03-27 LAB — HEPATITIS B CORE ANTIBODY, TOTAL: Hep B Core Total Ab: NEGATIVE

## 2024-03-27 LAB — COMPREHENSIVE METABOLIC PANEL WITH GFR
ALT: 52 IU/L — ABNORMAL HIGH (ref 0–44)
AST: 25 IU/L (ref 0–40)
Albumin: 4.9 g/dL (ref 4.3–5.2)
Alkaline Phosphatase: 84 IU/L (ref 44–121)
BUN/Creatinine Ratio: 10 (ref 9–20)
BUN: 11 mg/dL (ref 6–20)
Bilirubin Total: 0.3 mg/dL (ref 0.0–1.2)
CO2: 21 mmol/L (ref 20–29)
Calcium: 10.3 mg/dL — ABNORMAL HIGH (ref 8.7–10.2)
Chloride: 100 mmol/L (ref 96–106)
Creatinine, Ser: 1.07 mg/dL (ref 0.76–1.27)
Globulin, Total: 2.8 g/dL (ref 1.5–4.5)
Glucose: 94 mg/dL (ref 70–99)
Potassium: 4.2 mmol/L (ref 3.5–5.2)
Sodium: 139 mmol/L (ref 134–144)
Total Protein: 7.7 g/dL (ref 6.0–8.5)
eGFR: 96 mL/min/{1.73_m2} (ref >59)

## 2024-03-27 LAB — ANTI-SMOOTH MUSCLE ANTIBODY, IGG: Smooth Muscle Ab: 5 U (ref 0–19)

## 2024-03-27 LAB — HEPATITIS C ANTIBODY: Hep C Virus Ab: NONREACTIVE

## 2024-03-27 LAB — PROTIME-INR
INR: 1 (ref 0.9–1.2)
Prothrombin Time: 11.3 s (ref 9.1–12.0)

## 2024-03-27 LAB — HEPATITIS A ANTIBODY, TOTAL: hep A Total Ab: NEGATIVE

## 2024-03-27 LAB — HEPATITIS B SURFACE ANTIGEN: Hepatitis B Surface Ag: NEGATIVE

## 2024-03-27 LAB — ANA: ANA Titer 1: POSITIVE — AB

## 2024-03-27 LAB — HIV ANTIBODY (ROUTINE TESTING W REFLEX): HIV Screen 4th Generation wRfx: NONREACTIVE

## 2024-03-27 LAB — HEPATITIS B SURFACE ANTIBODY,QUALITATIVE: Hep B Surface Ab, Qual: NONREACTIVE

## 2024-04-03 ENCOUNTER — Ambulatory Visit (INDEPENDENT_AMBULATORY_CARE_PROVIDER_SITE_OTHER): Payer: Self-pay | Admitting: Gastroenterology

## 2024-04-03 NOTE — Progress Notes (Signed)
 Labs sent for elevated liver enzymes  ANA positive -1:160 Negative ASMA  ALT 52-elevated AST 25 INR 1.0  Normal iron profile ferritin 82  Hepatitis A nonimmune Hepatitis B not exposed nonimmune Hepatitis C negative HIV negative  ============  Will obtain total IgG in future and referral to rheumatology for positive ANA

## 2024-04-07 ENCOUNTER — Encounter (HOSPITAL_BASED_OUTPATIENT_CLINIC_OR_DEPARTMENT_OTHER): Payer: Self-pay

## 2024-04-07 ENCOUNTER — Emergency Department (HOSPITAL_BASED_OUTPATIENT_CLINIC_OR_DEPARTMENT_OTHER)
Admission: EM | Admit: 2024-04-07 | Discharge: 2024-04-07 | Disposition: A | Discharge status: Home / Self Care | Payer: MEDICAID

## 2024-04-07 ENCOUNTER — Other Ambulatory Visit: Payer: Self-pay

## 2024-04-07 ENCOUNTER — Emergency Department (HOSPITAL_BASED_OUTPATIENT_CLINIC_OR_DEPARTMENT_OTHER): Payer: MEDICAID

## 2024-04-07 DIAGNOSIS — K921 Melena: Secondary | ICD-10-CM | POA: Diagnosis not present

## 2024-04-07 DIAGNOSIS — R1031 Right lower quadrant pain: Secondary | ICD-10-CM | POA: Diagnosis present

## 2024-04-07 DIAGNOSIS — D72829 Elevated white blood cell count, unspecified: Secondary | ICD-10-CM | POA: Insufficient documentation

## 2024-04-07 LAB — COMPREHENSIVE METABOLIC PANEL WITH GFR
ALT: 58 U/L — ABNORMAL HIGH (ref 0–44)
AST: 31 U/L (ref 15–41)
Albumin: 4.6 g/dL (ref 3.5–5.0)
Alkaline Phosphatase: 90 U/L (ref 38–126)
Anion gap: 13 (ref 5–15)
BUN: 14 mg/dL (ref 6–20)
CO2: 23 mmol/L (ref 22–32)
Calcium: 10 mg/dL (ref 8.9–10.3)
Chloride: 101 mmol/L (ref 98–111)
Creatinine, Ser: 1.09 mg/dL (ref 0.61–1.24)
GFR, Estimated: >60 mL/min (ref >60)
Glucose, Bld: 90 mg/dL (ref 70–99)
Potassium: 4.3 mmol/L (ref 3.5–5.1)
Sodium: 136 mmol/L (ref 135–145)
Total Bilirubin: 0.4 mg/dL (ref 0.0–1.2)
Total Protein: 8.1 g/dL (ref 6.5–8.1)

## 2024-04-07 LAB — URINALYSIS, ROUTINE W REFLEX MICROSCOPIC
Bilirubin Urine: NEGATIVE
Glucose, UA: NEGATIVE mg/dL
Hgb urine dipstick: NEGATIVE
Ketones, ur: 15 mg/dL — AB
Leukocytes,Ua: NEGATIVE
Nitrite: NEGATIVE
Specific Gravity, Urine: 1.036 — ABNORMAL HIGH (ref 1.005–1.030)
pH: 6.5 (ref 5.0–8.0)

## 2024-04-07 LAB — CBC
HCT: 47.5 % (ref 39.0–52.0)
Hemoglobin: 16.3 g/dL (ref 13.0–17.0)
MCH: 29.3 pg (ref 26.0–34.0)
MCHC: 34.3 g/dL (ref 30.0–36.0)
MCV: 85.4 fL (ref 80.0–100.0)
Platelets: 362 10*3/uL (ref 150–400)
RBC: 5.56 MIL/uL (ref 4.22–5.81)
RDW: 12.2 % (ref 11.5–15.5)
WBC: 12.5 10*3/uL — ABNORMAL HIGH (ref 4.0–10.5)
nRBC: 0 % (ref 0.0–0.2)

## 2024-04-07 LAB — LIPASE, BLOOD: Lipase: 23 U/L (ref 11–51)

## 2024-04-07 LAB — OCCULT BLOOD X 1 CARD TO LAB, STOOL: Fecal Occult Bld: POSITIVE — AB

## 2024-04-07 MED ORDER — IOHEXOL 300 MG/ML  SOLN
100.0000 mL | Freq: Once | INTRAMUSCULAR | Status: AC | PRN
  Administered 2024-04-07: 100 mL via INTRAVENOUS

## 2024-04-07 MED ORDER — LACTATED RINGERS IV BOLUS
1000.0000 mL | Freq: Once | INTRAVENOUS | Status: AC
  Administered 2024-04-07: 1000 mL via INTRAVENOUS

## 2024-04-07 MED ORDER — ONDANSETRON HCL 4 MG PO TABS: 4.0000 mg | ORAL_TABLET | Freq: Four times a day (QID) | ORAL | 0 refills | Status: AC

## 2024-04-07 NOTE — ED Triage Notes (Signed)
 He c/o some lower abd. Pain. He states he has had some diarrhea stools this week, but that has improved for past couple of days. He is also concerned d/t seeing some blood in my stools.

## 2024-04-07 NOTE — ED Provider Notes (Signed)
 [place] EMERGENCY DEPARTMENT AT [facility] Provider Note   CSN: [id number] Arrival date & time: 04/07/24  1705     Patient presents with: Abdominal Pain   Kevin Wyatt is a 28 y.o. male.    Abdominal Pain Patient is a 28 year old male presenting to ED today with complaints of diarrhea, lower abdominal pain and bloody stools that started acutely last night and has been persistent throughout the day today.  Previous medical history of GERD, H. pylori, being seen by GI and scheduled for endoscopy.  Was told to stop his PPIs as he was going to get retested for H. pylori.  Notes to have also had accompanying nausea as well as general malaise but otherwise is not complaining of any other symptoms.  States that the only things he ate yesterday were cheeseburgers and Ramen.  Denies fever, headache, vision changes, chest pain, shortness of breath, vomiting, dysuria, hematuria, lower leg swelling.     Prior to Admission medications   Medication Sig Start Date End Date Taking? Authorizing Provider  ondansetron  (ZOFRAN ) 4 MG tablet Take 1 tablet (4 mg total) by mouth every 6 (six) hours. 04/07/24  Yes Beola Terrall RAMAN, PA-C  ALPRAZolam (XANAX) 0.5 MG tablet Take 0.5 mg by mouth 3 (three) times daily. 11/01/23   [provider]  olmesartan (BENICAR) 20 MG tablet Take 20 mg by mouth daily. 03/02/24   [provider]  ondansetron  (ZOFRAN -ODT) 8 MG disintegrating tablet Take 1 tablet (8 mg total) by mouth every 8 (eight) hours as needed for nausea or vomiting. Patient not taking: Reported on 03/19/2024 05/04/23   Randol Simmonds, MD  pantoprazole  (PROTONIX ) 20 MG tablet Take 1 tablet (20 mg total) by mouth daily. 05/04/23   Randol Simmonds, MD  sucralfate  (CARAFATE ) 1 g tablet Take 1 tablet (1 g total) by mouth 4 (four) times daily -  with meals and at bedtime. Patient not taking: Reported on 03/19/2024 05/04/23   Randol Simmonds, MD  ZEPBOUND 7.5 MG/0.5ML Pen Inject 7.5 mg into the skin  once a week. 02/26/24   [provider]  Omeprazole -Sodium Bicarbonate  (ZEGERID ) 20-1100 MG CAPS capsule Take 1 capsule by mouth 2 (two) times daily before a meal. Patient not taking: Reported on 07/26/2020 09/21/16 07/27/20  Shirlean Therisa LELON, NP    Allergies: Patient has no known allergies.    Review of Systems  Gastrointestinal:  Positive for abdominal pain and blood in stool.  All other systems reviewed and are negative.   Updated Vital Signs BP 111/78   Pulse 82   Temp 97.7 F (36.5 C) (Oral)   Resp 18   SpO2 98%   Physical Exam Vitals and nursing note reviewed. Exam conducted with a chaperone present.  Constitutional:      General: He is not in acute distress.    Appearance: Normal appearance. He is not ill-appearing or diaphoretic.  HENT:     Head: Normocephalic and atraumatic.   Eyes:     Extraocular Movements: Extraocular movements intact.     Conjunctiva/sclera: Conjunctivae normal.    Cardiovascular:     Rate and Rhythm: Normal rate and regular rhythm.     Pulses: Normal pulses.     Heart sounds: Normal heart sounds. No murmur heard.    No friction rub. No gallop.  Pulmonary:     Effort: Pulmonary effort is normal. No respiratory distress.     Breath sounds: Normal breath sounds. No stridor. No wheezing, rhonchi or rales.  Abdominal:  General: Abdomen is flat. There is no distension.     Palpations: Abdomen is soft. There is no mass.     Tenderness: There is abdominal tenderness in the right lower quadrant. There is no right CVA tenderness, left CVA tenderness, guarding or rebound. Negative signs include Murphy's sign and Rovsing's sign.  Genitourinary:    Penis: Normal.      Testes: Normal.     Rectum: Normal. Guaiac result positive. No tenderness, anal fissure, external hemorrhoid or internal hemorrhoid.   Skin:    General: Skin is warm and dry.     Coloration: Skin is not mottled or pale.     Findings: No erythema.   Neurological:      General: No focal deficit present.     Mental Status: He is alert. Mental status is at baseline.   Psychiatric:        Mood and Affect: Mood normal.     (all labs ordered are listed, but only abnormal results are displayed) Labs Reviewed  COMPREHENSIVE METABOLIC PANEL WITH GFR - Abnormal; Notable for the following components:      Result Value   ALT 58 (*)    All other components within normal limits  CBC - Abnormal; Notable for the following components:   WBC 12.5 (*)    All other components within normal limits  URINALYSIS, ROUTINE W REFLEX MICROSCOPIC - Abnormal; Notable for the following components:   Specific Gravity, Urine 1.036 (*)    Ketones, ur 15 (*)    Protein, ur TRACE (*)    All other components within normal limits  OCCULT BLOOD X 1 CARD TO LAB, STOOL - Abnormal; Notable for the following components:   Fecal Occult Bld POSITIVE (*)    All other components within normal limits  LIPASE, BLOOD  POC OCCULT BLOOD, ED    EKG: None  Radiology: CT ABDOMEN PELVIS W CONTRAST Result Date: 04/07/2024 CLINICAL DATA:  Right lower quadrant abdominal pain. EXAM: CT ABDOMEN AND PELVIS WITH CONTRAST TECHNIQUE: Multidetector CT imaging of the abdomen and pelvis was performed using the standard protocol following bolus administration of intravenous contrast. RADIATION DOSE REDUCTION: This exam was performed according to the departmental dose-optimization program which includes automated exposure control, adjustment of the mA and/or kV according to patient size and/or use of iterative reconstruction technique. CONTRAST:  OMNIPAQUE  IOHEXOL  300 MG/ML  SOLN COMPARISON:  May 04, 2023 FINDINGS: Lower chest: No acute abnormality. Hepatobiliary: No focal liver abnormality is seen. No gallstones, gallbladder wall thickening, or biliary dilatation. Pancreas: Unremarkable. No pancreatic ductal dilatation or surrounding inflammatory changes. Spleen: Normal in size without focal abnormality.  Adrenals/Urinary Tract: Adrenal glands are unremarkable. Kidneys are normal, without renal calculi, focal lesion, or hydronephrosis. Bladder is unremarkable. Stomach/Bowel: Stomach is within normal limits. Appendix appears normal. No evidence of bowel wall thickening, distention, or inflammatory changes. Vascular/Lymphatic: No significant vascular findings are present. No enlarged abdominal or pelvic lymph nodes. Reproductive: Prostate is unremarkable. Other: A small, stable fat containing umbilical hernia is noted. No abdominopelvic ascites. Musculoskeletal: There is mild dextroscoliosis of the lumbar spine. No acute or significant osseous findings. IMPRESSION: No acute or active process within the abdomen or pelvis. Electronically Signed   By: Suzen Dials M.D.   On: 04/07/2024 18:43    Procedures   Medications Ordered in the ED  lactated ringers bolus 1,000 mL (0 mLs Intravenous Stopped 04/07/24 2009)  iohexol  (OMNIPAQUE ) 300 MG/ML solution 100 mL (100 mLs Intravenous Contrast Given 04/07/24 1830)  Clinical Course as of 04/07/24 2043  Sun Apr 07, 2024  8144 CT ABDOMEN PELVIS W CONTRAST [CB]    Clinical Course User Index [CB] Beola Terrall RAMAN, PA-C    Medical Decision Making Amount and/or Complexity of Data Reviewed Labs: ordered. Radiology: ordered. Decision-making details documented in ED Course.  Risk Prescription drug management.   This patient is a 28 year old male who presents to the ED for concern of bloody diarrhea that started acutely last night.  Complaining of some mild right lower quadrant abdominal pain.  Is also, some mild nausea as well as bright red bleeding per rectum.  On physical exam, patient is in no acute distress, afebrile, alert and orient x 4, speaking in full sentences, nontachypneic, nontachycardic.  Noted to have mild right lower quadrant abdominal pain to palpation.  Exam is otherwise unremarkable. Labs showed a leukocytosis.  With right lower  quadrant abdominal pain and bright red blood, will CT abdomen.  CT abdomen was unremarkable for any acute normality.  Fecal occult was positive for blood.  With him already scheduled for visit with GI, will have him call the GI office to see if they have any earlier appointments in the interim, monitor symptoms at home and return to the ED for any new or worsening symptoms.  Will provide nausea medications as well as have him continue take Tylenol  at home for pain medications.  Patient vital signs have remained stable throughout the course of patient's time in the ED. Low suspicion for any other emergent pathology at this time. I believe this patient is safe to be discharged. Provided strict return to ER precautions. Patient expressed agreement and understanding of plan. All questions were answered.   Differential diagnoses prior to evaluation: The emergent differential diagnosis includes, but is not limited to, hemorrhoids, anal fissure, diverticulitis, colorectal cancer, IBD, IBS, infectious colitis, mesenteric ischemia, esophageal varices, fistula, appendicitis, testicular torsion. This is not an exhaustive differential.   Past Medical History / Co-morbidities / Social History: H. pylori, GERD, anxiety, depression, nausea, elevated liver enzymes  Additional history: Chart reviewed. Pertinent results include:   Seen on 04/15/2024 by GI for GERD.  He was noting postprandial abdominal discomfort at that time.  Scheduled for an upper endoscopy.  Noted to have elevated liver enzymes at that time which was suspected to be hepatic steatosis.  Also noted to have a positive ANA at that time.  Lab Tests/Imaging studies: I personally interpreted labs/imaging and the pertinent results include:    CBC notes a leukocytosis of 12.5 CMP shows a mildly elevated ALT of 58 but otherwise unremarkable, stable from previous Lipase unremarkable UA shows ketones as well as protein as well as elevated specific  gravity, likely secondary to dehydration, noting diarrhea this past week.. CT scan was unremarkable, fecal occult was positive  I agree with the radiologist interpretation.    Medications: I ordered medication including Zofran .  I have reviewed the patients home medicines and have made adjustments as needed.  Critical Interventions: none  Social Determinants of Health: Has good follow-up with GI.  Disposition: After consideration of the diagnostic results and the patients response to treatment, I feel that the patient would benefit from discharge and treatment as above.   emergency department workup does not suggest an emergent condition requiring admission or immediate intervention beyond what has been performed at this time. The plan is: Follow-up with GI, return to the ED for new or worsening symptoms, Zofran  for nausea, Tylenol  for symptomatic management. The patient  is safe for discharge and has been instructed to return immediately for worsening symptoms, change in symptoms or any other concerns.   Final diagnoses:  RLQ abdominal pain  Hematochezia    ED Discharge Orders          Ordered    ondansetron  (ZOFRAN ) 4 MG tablet  Every 6 hours        04/07/24 2042               Beola Terrall RAMAN, [place] 04/07/24 2044    Neysa Caron PARAS, DO 04/07/24 2327

## 2024-04-07 NOTE — Discharge Instructions (Signed)
 You are seen today for right lower quadrant abdominal pain accompanied with blood in stool.  With reassuring labs and physical exam, I have low suspicion for any emergent causes of your symptoms today.  However recommend continued follow-up with your GI doctor and call their office to see if they can schedule you for an earlier appointment.  I am prescribing you some nausea medications which you can use as needed every 6 hours.  I can also use Tylenol  for pain relief.  Take Tylenol  (acetominophen)  650mg  every 4-6 hours, as needed for pain or fever. Do not take more than 4,000 mg in a 24-hour period. As this may cause liver damage. While this is rare, if you begin to develop yellowing of the skin or eyes, stop taking and return to ER immediately.  Return to the ED if begin to have any new or worsening symptoms which include chest pain, shortness of breath, headache with vision changes, persistent vomiting, copious blood in stool causing lightheadedness.

## 2024-04-23 ENCOUNTER — Encounter (HOSPITAL_COMMUNITY)
Admission: RE | Disposition: A | Discharge status: Home / Self Care | Payer: Self-pay | Source: Home / Self Care | Attending: Gastroenterology

## 2024-04-23 ENCOUNTER — Telehealth: Payer: Self-pay | Admitting: Gastroenterology

## 2024-04-23 ENCOUNTER — Ambulatory Visit (HOSPITAL_COMMUNITY)
Admission: RE | Admit: 2024-04-23 | Discharge: 2024-04-23 | Disposition: A | Discharge status: Home / Self Care | Payer: MEDICAID | Source: Home / Self Care | Attending: Gastroenterology | Admitting: Gastroenterology

## 2024-04-23 ENCOUNTER — Other Ambulatory Visit: Payer: Self-pay

## 2024-04-23 ENCOUNTER — Ambulatory Visit (HOSPITAL_COMMUNITY): Payer: MEDICAID | Admitting: Certified Registered"

## 2024-04-23 ENCOUNTER — Encounter (HOSPITAL_COMMUNITY): Payer: Self-pay | Admitting: Gastroenterology

## 2024-04-23 SURGERY — CANCELLED PROCEDURE
Anesthesia: Choice

## 2024-04-23 NOTE — OR Nursing (Signed)
 Patient took his injectable Zepbound on Sunday, July 6, stating he did not know it had to be stopped 7 days before the procedure. He ate breakfast yesterday and 2 chicken breast at 17:30. After discussing with Dr. Cinderella and Dr. Kendell, the procedure was cancelled and will be rescheduled by the office.

## 2024-04-23 NOTE — Telephone Encounter (Signed)
-----   Message from Deatrice JULIANNA Dine sent at 04/23/2024 10:59 AM EDT ----- Regarding: EGD Hi This patient was for EGD today   Patient is on Zepbound  and took last dose on Sunday  Please reschedule EGD and hold zepbound for 7 days. I reiterated to patient as well and can resend the instructions  Thank you

## 2024-04-23 NOTE — Telephone Encounter (Signed)
 WILL CALL ONCE WE SCHEDULE FOR [place]

## 2024-04-23 NOTE — Telephone Encounter (Signed)
 Pt came by front desk to say he was scheduled a procedure today with Dr Cinderella and since he didn't stop his zepbound ?  He needed to reschedule his procedure. [phone]

## 2024-04-23 NOTE — H&P (Signed)
 Patient being rescheduled as he took GLP-1 agonist less than 7 days ago and had a regular diet yesterday

## 2024-04-25 NOTE — Telephone Encounter (Signed)
LMOVM to call back to reschedule 

## 2024-05-08 NOTE — Telephone Encounter (Signed)
 LMOVM to call back. Letter mailed. ?

## 2024-05-16 ENCOUNTER — Telehealth (INDEPENDENT_AMBULATORY_CARE_PROVIDER_SITE_OTHER): Payer: Self-pay | Admitting: Gastroenterology

## 2024-05-16 ENCOUNTER — Encounter (INDEPENDENT_AMBULATORY_CARE_PROVIDER_SITE_OTHER): Payer: Self-pay | Admitting: Gastroenterology

## 2024-05-16 NOTE — Telephone Encounter (Signed)
 Does patient need an OV prior to rescheduling his EGD from June? Please advise. [phone]

## 2024-05-20 NOTE — Telephone Encounter (Signed)
 No see prior message from 04/23/24  Called, LMOVM to call back

## 2024-05-23 ENCOUNTER — Encounter: Payer: Self-pay | Admitting: *Deleted

## 2024-05-23 NOTE — Telephone Encounter (Signed)
 Pt has been scheduled for 06/11/24. Instructions mailed.

## 2024-06-11 ENCOUNTER — Encounter (HOSPITAL_COMMUNITY): Payer: Self-pay | Admitting: Gastroenterology

## 2024-06-11 ENCOUNTER — Ambulatory Visit (HOSPITAL_COMMUNITY)
Admission: RE | Admit: 2024-06-11 | Discharge: 2024-06-11 | Disposition: A | Discharge status: Home / Self Care | Payer: MEDICAID | Attending: Gastroenterology | Admitting: Gastroenterology

## 2024-06-11 ENCOUNTER — Ambulatory Visit (HOSPITAL_COMMUNITY): Payer: MEDICAID | Admitting: Anesthesiology

## 2024-06-11 ENCOUNTER — Encounter (HOSPITAL_COMMUNITY)
Admission: RE | Disposition: A | Discharge status: Home / Self Care | Payer: Self-pay | Source: Home / Self Care | Attending: Gastroenterology

## 2024-06-11 ENCOUNTER — Other Ambulatory Visit: Payer: Self-pay

## 2024-06-11 ENCOUNTER — Ambulatory Visit (HOSPITAL_BASED_OUTPATIENT_CLINIC_OR_DEPARTMENT_OTHER): Payer: MEDICAID | Admitting: Anesthesiology

## 2024-06-11 DIAGNOSIS — K2101 Gastro-esophageal reflux disease with esophagitis, with bleeding: Secondary | ICD-10-CM | POA: Insufficient documentation

## 2024-06-11 DIAGNOSIS — K3189 Other diseases of stomach and duodenum: Secondary | ICD-10-CM

## 2024-06-11 DIAGNOSIS — Z6834 Body mass index (BMI) 34.0-34.9, adult: Secondary | ICD-10-CM | POA: Diagnosis not present

## 2024-06-11 DIAGNOSIS — K2091 Esophagitis, unspecified with bleeding: Secondary | ICD-10-CM

## 2024-06-11 DIAGNOSIS — E669 Obesity, unspecified: Secondary | ICD-10-CM | POA: Insufficient documentation

## 2024-06-11 DIAGNOSIS — Z87891 Personal history of nicotine dependence: Secondary | ICD-10-CM | POA: Diagnosis not present

## 2024-06-11 DIAGNOSIS — K2970 Gastritis, unspecified, without bleeding: Secondary | ICD-10-CM | POA: Diagnosis not present

## 2024-06-11 DIAGNOSIS — R1013 Epigastric pain: Secondary | ICD-10-CM | POA: Diagnosis present

## 2024-06-11 DIAGNOSIS — K209 Esophagitis, unspecified without bleeding: Secondary | ICD-10-CM

## 2024-06-11 HISTORY — PX: ESOPHAGOGASTRODUODENOSCOPY: SHX5428

## 2024-06-11 SURGERY — EGD (ESOPHAGOGASTRODUODENOSCOPY)
Anesthesia: General

## 2024-06-11 MED ORDER — PROPOFOL 10 MG/ML IV BOLUS
INTRAVENOUS | Status: DC | PRN
  Administered 2024-06-11 (×3): 100 mg via INTRAVENOUS

## 2024-06-11 MED ORDER — LIDOCAINE 2% (20 MG/ML) 5 ML SYRINGE
INTRAMUSCULAR | Status: DC | PRN
  Administered 2024-06-11: 80 mg via INTRAVENOUS

## 2024-06-11 MED ORDER — LACTATED RINGERS IV SOLN: INTRAVENOUS | Status: DC | PRN

## 2024-06-11 MED ORDER — OMEPRAZOLE 40 MG PO CPDR
40.0000 mg | DELAYED_RELEASE_CAPSULE | Freq: Every day | ORAL | 3 refills | Status: AC
Start: 2024-06-11 — End: ?

## 2024-06-11 MED ORDER — LACTATED RINGERS IV SOLN: INTRAVENOUS | Status: DC

## 2024-06-11 NOTE — Discharge Instructions (Addendum)

## 2024-06-11 NOTE — Transfer of Care (Signed)
 Immediate Anesthesia Transfer of Care Note  Patient: Kevin Wyatt  Procedure(s) Performed: EGD (ESOPHAGOGASTRODUODENOSCOPY)  Patient Location: PACU  Anesthesia Type:General  Level of Consciousness: awake  Airway & Oxygen Therapy: Patient Spontanous Breathing  Post-op Assessment: Report given to RN  Post vital signs: Reviewed and stable  Last Vitals:  Vitals Value Taken Time  BP    Temp    Pulse    Resp    SpO2      Last Pain:  Vitals:   06/11/24 1234  TempSrc:   PainSc: 0-No pain      Patients Stated Pain Goal: 5 (06/11/24 1213)  Complications: No notable events documented.

## 2024-06-11 NOTE — H&P (Signed)
 Primary Care Physician:  Marvine Rush, MD Primary Gastroenterologist:  Dr. Cinderella  Pre-Procedure History & Physical: HPI: Kevin Wyatt is a 28 y.o. male with ADHD, chronic GERD who presents for evaluation of GERD, abdominal pain.   He is concerned about  gastritis.  His main complaint remains epigastric discomfort mostly postprandial with occasional heartburn. Patient is on Zepbound for weight loss. Stool H. pylori was + 10/2022 patient took antibiotics for 2 weeks. Patient has been on pantoprazole  20 mg BID. The patient denies having any nausea, vomiting, fever, chills, hematochezia, melena, hematemesis,, diarrhea, jaundice, pruritus or weight loss.   Last ZHI:wnwz Last Colonoscopy:none   FHx: neg for any gastrointestinal/liver disease, no malignancies Social: Previously with heavy alcohol intake 12 beers per day but currently only occasionally Surgical: no abdominal surgeries   Labs from 10/2022 hemoglobin 16 ALT 81 AST 28  Past Medical History:  Diagnosis Date   ADHD    ADHD (attention deficit hyperactivity disorder)    Anxiety    Depression    Dizziness    GERD (gastroesophageal reflux disease)    w/ prrosis & erucatation   Nausea    Obesity    Vasovagal syncope     Past Surgical History:  Procedure Laterality Date   None     tongue clip     as a child    Prior to Admission medications   Medication Sig Start Date End Date Taking? Authorizing Provider  ALPRAZolam (XANAX) 0.5 MG tablet Take 0.5 mg by mouth 3 (three) times daily. 11/01/23  Yes [provider]  olmesartan (BENICAR) 20 MG tablet Take 20 mg by mouth daily. 03/02/24  Yes [provider]  ondansetron  (ZOFRAN ) 4 MG tablet Take 1 tablet (4 mg total) by mouth every 6 (six) hours. 04/07/24  Yes Beola Terrall RAMAN, PA-C  ondansetron  (ZOFRAN -ODT) 8 MG disintegrating tablet Take 1 tablet (8 mg total) by mouth every 8 (eight) hours as needed for nausea or vomiting. 05/04/23  Yes Randol Simmonds, MD   sucralfate  (CARAFATE ) 1 g tablet Take 1 tablet (1 g total) by mouth 4 (four) times daily -  with meals and at bedtime. 05/04/23  Yes Randol Simmonds, MD  pantoprazole  (PROTONIX ) 20 MG tablet Take 1 tablet (20 mg total) by mouth daily. 05/04/23   Randol Simmonds, MD  ZEPBOUND 7.5 MG/0.5ML Pen Inject 7.5 mg into the skin once a week. 02/26/24   [provider]  Omeprazole -Sodium Bicarbonate  (ZEGERID ) 20-1100 MG CAPS capsule Take 1 capsule by mouth 2 (two) times daily before a meal. Patient not taking: Reported on 07/26/2020 09/21/16 07/27/20  Shirlean Therisa LELON, NP    Allergies as of 05/23/2024   (No Known Allergies)    Family History  Problem Relation Age of Onset   Hypertension Mother    Cancer Father    Hypertension Father    Schizophrenia Maternal Uncle    Bipolar disorder Maternal Uncle    Alzheimer's disease Maternal Grandmother    Stroke Maternal Grandmother    Heart attack Maternal Grandmother    Hypertension Maternal Grandfather    Hypertension Paternal Grandmother    Heart attack Paternal Grandmother    Lung cancer Paternal Grandmother    Cancer Paternal Grandfather    Colon cancer Neg Hx     Social History   Socioeconomic History   Marital status: Single    Spouse name: Not on file   Number of children: Not on file   Years of education: Not on file  Highest education level: Not on file  Occupational History   Not on file  Tobacco Use   Smoking status: Former    Current packs/day: 0.00    Average packs/day: 0.5 packs/day for 5.0 years (2.5 ttl pk-yrs)    Types: Cigarettes    Start date: 10/18/2011    Quit date: 10/17/2016    Years since quitting: 7.6   Smokeless tobacco: Never   Tobacco comments:    uses vape   Vaping Use   Vaping status: Every Day  Substance and Sexual Activity   Alcohol use: Yes    Comment: couple drinks of etoh 1-2 times per week   Drug use: Not Currently    Types: Marijuana, Cocaine    Comment: no MJ since 28yo. no cocaine since 28yo    Sexual activity: Yes    Birth control/protection: Condom  Other Topics Concern   Not on file  Social History Narrative   Not on file   Social Drivers of Health   Financial Resource Strain: Not on file  Food Insecurity: Not on file  Transportation Needs: Not on file  Physical Activity: Not on file  Stress: Not on file  Social Connections: Not on file  Intimate Partner Violence: Not on file    Review of Systems: See HPI, otherwise negative ROS  Physical Exam: Vital signs in last 24 hours: Temp:  [97.9 F (36.6 C)] 97.9 F (36.6 C) (08/26 1213) Pulse Rate:  [77] 77 (08/26 1213) Resp:  [13] 13 (08/26 1213) SpO2:  [6 %] 6 % (08/26 1213) Weight:  [113.4 kg] 113.4 kg (08/26 1213)   General:   Alert,  Well-developed, well-nourished, pleasant and cooperative in NAD Head:  Normocephalic and atraumatic. Eyes:  Sclera clear, no icterus.   Conjunctiva pink. Ears:  Normal auditory acuity. Nose:  No deformity, discharge,  or lesions. Msk:  Symmetrical without gross deformities. Normal posture. Extremities:  Without clubbing or edema. Neurologic:  Alert and  oriented x4;  grossly normal neurologically. Skin:  Intact without significant lesions or rashes. Psych:  Alert and cooperative. Normal mood and affect.  Impression/Plan:  Kevin Wyatt is a 28 y.o. male with ADHD, chronic GERD who presents for evaluation of GERD, abdominal pain.   Proceed with Egd with biopsies   The risks of the procedure including infection, bleed, or perforation as well as benefits, limitations, alternatives and imponderables have been reviewed with the patient. Questions have been answered. All parties agreeable.

## 2024-06-11 NOTE — Op Note (Signed)
 [facility] Patient Name: Kevin Wyatt Procedure Date: 06/11/2024 12:21 PM MRN: [id number] Date of Birth: [birth year] Attending MD: Deatrice Dine , MD, [id number] CSN: [id number] Age: 28 Admit Type: Outpatient Procedure:                Upper GI endoscopy Indications:              Epigastric abdominal pain Providers:                Deatrice Dine, MD, Jon LABOR. Museum/gallery exhibitions officer, Charity fundraiser,                            Mickel CROME. Shirlean Balm, Technician Referring MD:              Medicines:                Monitored Anesthesia Care Complications:            No immediate complications. Estimated Blood Loss:     Estimated blood loss was minimal. Procedure:                Pre-Anesthesia Assessment:                           - Prior to the procedure, a History and Physical                            was performed, and patient medications and                            allergies were reviewed. The patient's tolerance of                            previous anesthesia was also reviewed. The risks                            and benefits of the procedure and the sedation                            options and risks were discussed with the patient.                            All questions were answered, and informed consent                            was obtained. Prior Anticoagulants: The patient has                            taken no anticoagulant or antiplatelet agents. ASA                            Grade Assessment: II - A patient with mild systemic                            disease. After reviewing the risks and benefits,  the patient was deemed in satisfactory condition to                            undergo the procedure.                           After obtaining informed consent, the endoscope was                            passed under direct vision. Throughout the                            procedure, the patient's blood pressure, pulse, and                             oxygen saturations were monitored continuously. The                            [id number] ([id number]) Upper was introduced through                            the mouth, and advanced to the second part of                            duodenum. The upper GI endoscopy was accomplished                            with ease. The patient tolerated the procedure well. Scope In: 12:41:47 PM Scope Out: 12:48:17 PM Total Procedure Duration: 0 hours 6 minutes 30 seconds  Findings:      LA Grade C (one or more mucosal breaks continuous between tops of 2 or       more mucosal folds, less than 75% circumference) esophagitis with       bleeding was found in the lower third of the esophagus.      Moderate inflammation characterized by erosions and erythema was found       in the entire examined stomach. Biopsies were taken with a cold forceps       for histology.      Moderately erythematous mucosa without active bleeding and with no       stigmata of bleeding was found in the duodenal bulb and in the second       portion of the duodenum. Biopsies were taken with a cold forceps for       histology. Impression:               - LA Grade C esophagitis with bleeding. (picture of                            esophagitis not caputured)                           - Gastritis. Biopsied.                           - Erythematous duodenopathy. Biopsied. Moderate Sedation:      Per Anesthesia Care  Recommendation:           - Patient has a contact number available for                            emergencies. The signs and symptoms of potential                            delayed complications were discussed with the                            patient. Return to normal activities tomorrow.                            Written discharge instructions were provided to the                            patient.                           - Resume previous diet.                           - Continue present medications.                            - Await pathology results.                           - Repeat upper endoscopy in 3 months to check                            healing and to evaluate the response to therapy.                           - Return to GI clinic as previously scheduled.                           -Avoid NSAIDs and Alcohol                           -Omperazole 40mg  daily Procedure Code(s):        --- Professional ---                           [phone], Esophagogastroduodenoscopy, flexible,                            transoral; with biopsy, single or multiple Diagnosis Code(s):        --- Professional ---                           K20.91, Esophagitis, unspecified with bleeding                           K29.70, Gastritis, unspecified, without bleeding  K31.89, Other diseases of stomach and duodenum                           R10.13, Epigastric pain CPT copyright 2022 American Medical [employer]. All rights reserved. The codes documented in this report are preliminary and upon coder review may  be revised to meet current compliance requirements. Deatrice Dine, MD Deatrice Dine, MD 06/11/2024 12:53:03 PM This report has been signed electronically. Number of Addenda: 0

## 2024-06-11 NOTE — Anesthesia Preprocedure Evaluation (Signed)
 Anesthesia Evaluation  Patient identified by MRN, date of birth, ID band Patient awake    Reviewed: Allergy & Precautions, H&P , NPO status , Patient's Chart, lab work & pertinent test results, reviewed documented beta blocker date and time   Airway Mallampati: II  TM Distance: >3 FB Neck ROM: full    Dental no notable dental hx.    Pulmonary neg pulmonary ROS, former smoker   Pulmonary exam normal breath sounds clear to auscultation       Cardiovascular Exercise Tolerance: Good hypertension, negative cardio ROS  Rhythm:regular Rate:Normal     Neuro/Psych  PSYCHIATRIC DISORDERS Anxiety Depression    negative neurological ROS     GI/Hepatic Neg liver ROS,GERD  ,,  Endo/Other  negative endocrine ROS    Renal/GU negative Renal ROS  negative genitourinary   Musculoskeletal   Abdominal   Peds  Hematology negative hematology ROS (+)   Anesthesia Other Findings   Reproductive/Obstetrics negative OB ROS                             Anesthesia Physical Anesthesia Plan  ASA: 2  Anesthesia Plan: General   Post-op Pain Management:    Induction:   PONV Risk Score and Plan: Propofol infusion  Airway Management Planned:   Additional Equipment:   Intra-op Plan:   Post-operative Plan:   Informed Consent: I have reviewed the patients History and Physical, chart, labs and discussed the procedure including the risks, benefits and alternatives for the proposed anesthesia with the patient or authorized representative who has indicated his/her understanding and acceptance.     Dental Advisory Given  Plan Discussed with: CRNA  Anesthesia Plan Comments:        Anesthesia Quick Evaluation

## 2024-06-11 NOTE — Anesthesia Postprocedure Evaluation (Signed)
 Anesthesia Post Note  Patient: Kevin Wyatt  Procedure(s) Performed: EGD (ESOPHAGOGASTRODUODENOSCOPY)  Patient location during evaluation: Endoscopy Anesthesia Type: General Level of consciousness: awake and alert Pain management: pain level controlled Vital Signs Assessment: post-procedure vital signs reviewed and stable Respiratory status: spontaneous breathing Cardiovascular status: blood pressure returned to baseline and stable Postop Assessment: no apparent nausea or vomiting Anesthetic complications: no   No notable events documented.   Last Vitals:  Vitals:   06/11/24 1213 06/11/24 1256  BP:  125/74  Pulse: 77 85  Resp: 13 13  Temp: 36.6 C   SpO2: (!) 6% 97%    Last Pain:  Vitals:   06/11/24 1256  TempSrc:   PainSc: 0-No pain                 Aribella Vavra

## 2024-06-12 LAB — SURGICAL PATHOLOGY

## 2024-06-13 ENCOUNTER — Encounter (HOSPITAL_COMMUNITY): Payer: Self-pay | Admitting: Gastroenterology

## 2024-06-13 ENCOUNTER — Ambulatory Visit (INDEPENDENT_AMBULATORY_CARE_PROVIDER_SITE_OTHER): Payer: Self-pay | Admitting: Gastroenterology

## 2024-06-24 NOTE — Progress Notes (Signed)
 Patient result letter mailed

## 2024-07-19 ENCOUNTER — Encounter (INDEPENDENT_AMBULATORY_CARE_PROVIDER_SITE_OTHER): Payer: Self-pay | Admitting: *Deleted
